# Patient Record
Sex: Male | Born: 1950 | Race: Black or African American | Hispanic: No | Marital: Married | Smoking: Current some day smoker
Health system: Southern US, Community
[De-identification: ages and names within clinical notes are randomized; demographics above are authoritative.]

## PROBLEM LIST (undated history)

## (undated) DIAGNOSIS — I10 Essential (primary) hypertension: Secondary | ICD-10-CM

## (undated) HISTORY — PX: EXCISION OF ABDOMINAL WALL TUMOR: SHX6687

---

## 1997-10-06 ENCOUNTER — Emergency Department (HOSPITAL_COMMUNITY): Admission: EM | Admit: 1997-10-06 | Discharge: 1997-10-06 | Payer: Self-pay | Admitting: Internal Medicine

## 2010-12-25 ENCOUNTER — Other Ambulatory Visit: Payer: Self-pay | Admitting: Chiropractic Medicine

## 2010-12-25 DIAGNOSIS — M545 Low back pain: Secondary | ICD-10-CM

## 2010-12-26 ENCOUNTER — Ambulatory Visit
Admission: RE | Admit: 2010-12-26 | Discharge: 2010-12-26 | Disposition: A | Discharge status: Home / Self Care | Payer: Managed Care, Other (non HMO) | Source: Ambulatory Visit | Attending: Chiropractic Medicine | Admitting: Chiropractic Medicine

## 2010-12-26 DIAGNOSIS — M545 Low back pain: Secondary | ICD-10-CM

## 2017-05-20 DIAGNOSIS — H6123 Impacted cerumen, bilateral: Secondary | ICD-10-CM | POA: Diagnosis not present

## 2018-05-26 DIAGNOSIS — R3912 Poor urinary stream: Secondary | ICD-10-CM | POA: Diagnosis not present

## 2018-05-26 DIAGNOSIS — R972 Elevated prostate specific antigen [PSA]: Secondary | ICD-10-CM | POA: Diagnosis not present

## 2018-05-26 DIAGNOSIS — N5201 Erectile dysfunction due to arterial insufficiency: Secondary | ICD-10-CM | POA: Diagnosis not present

## 2018-07-13 DIAGNOSIS — I10 Essential (primary) hypertension: Secondary | ICD-10-CM | POA: Diagnosis not present

## 2018-07-13 DIAGNOSIS — R972 Elevated prostate specific antigen [PSA]: Secondary | ICD-10-CM | POA: Diagnosis not present

## 2018-08-03 DIAGNOSIS — L989 Disorder of the skin and subcutaneous tissue, unspecified: Secondary | ICD-10-CM | POA: Diagnosis not present

## 2018-08-03 DIAGNOSIS — L918 Other hypertrophic disorders of the skin: Secondary | ICD-10-CM | POA: Diagnosis not present

## 2018-08-03 DIAGNOSIS — M542 Cervicalgia: Secondary | ICD-10-CM | POA: Diagnosis not present

## 2018-08-03 DIAGNOSIS — I10 Essential (primary) hypertension: Secondary | ICD-10-CM | POA: Diagnosis not present

## 2018-08-03 DIAGNOSIS — R202 Paresthesia of skin: Secondary | ICD-10-CM | POA: Diagnosis not present

## 2018-08-27 DIAGNOSIS — S4991XA Unspecified injury of right shoulder and upper arm, initial encounter: Secondary | ICD-10-CM | POA: Diagnosis not present

## 2018-08-27 DIAGNOSIS — I10 Essential (primary) hypertension: Secondary | ICD-10-CM | POA: Diagnosis not present

## 2019-01-25 ENCOUNTER — Other Ambulatory Visit: Payer: Self-pay

## 2019-01-25 DIAGNOSIS — Z20822 Contact with and (suspected) exposure to covid-19: Secondary | ICD-10-CM

## 2019-01-26 LAB — NOVEL CORONAVIRUS, NAA: SARS-CoV-2, NAA: NOT DETECTED

## 2019-06-22 DIAGNOSIS — R3912 Poor urinary stream: Secondary | ICD-10-CM | POA: Diagnosis not present

## 2019-06-22 DIAGNOSIS — R972 Elevated prostate specific antigen [PSA]: Secondary | ICD-10-CM | POA: Diagnosis not present

## 2019-06-22 DIAGNOSIS — N5201 Erectile dysfunction due to arterial insufficiency: Secondary | ICD-10-CM | POA: Diagnosis not present

## 2019-07-28 DIAGNOSIS — M25511 Pain in right shoulder: Secondary | ICD-10-CM | POA: Diagnosis not present

## 2019-07-28 DIAGNOSIS — M1990 Unspecified osteoarthritis, unspecified site: Secondary | ICD-10-CM | POA: Diagnosis not present

## 2019-07-28 DIAGNOSIS — M7581 Other shoulder lesions, right shoulder: Secondary | ICD-10-CM | POA: Diagnosis not present

## 2019-12-03 DIAGNOSIS — M67911 Unspecified disorder of synovium and tendon, right shoulder: Secondary | ICD-10-CM | POA: Diagnosis not present

## 2019-12-03 DIAGNOSIS — M542 Cervicalgia: Secondary | ICD-10-CM | POA: Diagnosis not present

## 2020-01-26 DIAGNOSIS — M5416 Radiculopathy, lumbar region: Secondary | ICD-10-CM | POA: Diagnosis not present

## 2020-02-22 DIAGNOSIS — I10 Essential (primary) hypertension: Secondary | ICD-10-CM | POA: Diagnosis not present

## 2020-02-22 DIAGNOSIS — M5416 Radiculopathy, lumbar region: Secondary | ICD-10-CM | POA: Diagnosis not present

## 2020-02-22 DIAGNOSIS — Z6829 Body mass index (BMI) 29.0-29.9, adult: Secondary | ICD-10-CM | POA: Diagnosis not present

## 2020-03-27 DIAGNOSIS — N5201 Erectile dysfunction due to arterial insufficiency: Secondary | ICD-10-CM | POA: Diagnosis not present

## 2020-03-27 DIAGNOSIS — R3912 Poor urinary stream: Secondary | ICD-10-CM | POA: Diagnosis not present

## 2020-03-27 DIAGNOSIS — R972 Elevated prostate specific antigen [PSA]: Secondary | ICD-10-CM | POA: Diagnosis not present

## 2020-04-05 DIAGNOSIS — M5416 Radiculopathy, lumbar region: Secondary | ICD-10-CM | POA: Diagnosis not present

## 2021-04-25 ENCOUNTER — Other Ambulatory Visit: Payer: Self-pay | Admitting: Urology

## 2021-04-25 DIAGNOSIS — C61 Malignant neoplasm of prostate: Secondary | ICD-10-CM

## 2021-05-26 ENCOUNTER — Other Ambulatory Visit: Payer: Self-pay

## 2021-05-26 ENCOUNTER — Ambulatory Visit
Admission: RE | Admit: 2021-05-26 | Discharge: 2021-05-26 | Disposition: A | Discharge status: Home / Self Care | Payer: Managed Care, Other (non HMO) | Source: Ambulatory Visit | Attending: Urology | Admitting: Urology

## 2021-05-26 DIAGNOSIS — N402 Nodular prostate without lower urinary tract symptoms: Secondary | ICD-10-CM | POA: Diagnosis not present

## 2021-05-26 DIAGNOSIS — N400 Enlarged prostate without lower urinary tract symptoms: Secondary | ICD-10-CM | POA: Diagnosis not present

## 2021-05-26 DIAGNOSIS — C61 Malignant neoplasm of prostate: Secondary | ICD-10-CM

## 2021-05-26 DIAGNOSIS — R590 Localized enlarged lymph nodes: Secondary | ICD-10-CM | POA: Diagnosis not present

## 2021-05-26 MED ORDER — GADOBENATE DIMEGLUMINE 529 MG/ML IV SOLN
20.0000 mL | Freq: Once | INTRAVENOUS | Status: AC | PRN
  Administered 2021-05-26: 20 mL via INTRAVENOUS

## 2021-06-01 ENCOUNTER — Other Ambulatory Visit: Payer: Self-pay | Admitting: Urology

## 2021-06-01 DIAGNOSIS — C499 Malignant neoplasm of connective and soft tissue, unspecified: Secondary | ICD-10-CM

## 2021-06-15 ENCOUNTER — Other Ambulatory Visit: Payer: Self-pay | Admitting: Urology

## 2021-06-15 DIAGNOSIS — R1900 Intra-abdominal and pelvic swelling, mass and lump, unspecified site: Secondary | ICD-10-CM

## 2021-06-16 ENCOUNTER — Inpatient Hospital Stay: Admission: RE | Admit: 2021-06-16 | Payer: BC Managed Care – PPO | Source: Ambulatory Visit

## 2021-07-14 ENCOUNTER — Ambulatory Visit
Admission: RE | Admit: 2021-07-14 | Discharge: 2021-07-14 | Disposition: A | Discharge status: Home / Self Care | Payer: BC Managed Care – PPO | Source: Ambulatory Visit | Attending: Urology | Admitting: Urology

## 2021-07-14 DIAGNOSIS — R1900 Intra-abdominal and pelvic swelling, mass and lump, unspecified site: Secondary | ICD-10-CM | POA: Diagnosis not present

## 2021-07-14 DIAGNOSIS — C499 Malignant neoplasm of connective and soft tissue, unspecified: Secondary | ICD-10-CM

## 2021-07-14 MED ORDER — GADOBENATE DIMEGLUMINE 529 MG/ML IV SOLN
19.0000 mL | Freq: Once | INTRAVENOUS | Status: AC | PRN
  Administered 2021-07-14: 19 mL via INTRAVENOUS

## 2021-10-08 DIAGNOSIS — R1900 Intra-abdominal and pelvic swelling, mass and lump, unspecified site: Secondary | ICD-10-CM | POA: Diagnosis not present

## 2021-10-08 DIAGNOSIS — Z6831 Body mass index (BMI) 31.0-31.9, adult: Secondary | ICD-10-CM | POA: Diagnosis not present

## 2021-11-02 DIAGNOSIS — Z8589 Personal history of malignant neoplasm of other organs and systems: Secondary | ICD-10-CM | POA: Diagnosis not present

## 2021-11-02 DIAGNOSIS — R1900 Intra-abdominal and pelvic swelling, mass and lump, unspecified site: Secondary | ICD-10-CM | POA: Diagnosis not present

## 2021-11-02 DIAGNOSIS — D1723 Benign lipomatous neoplasm of skin and subcutaneous tissue of right leg: Secondary | ICD-10-CM | POA: Diagnosis not present

## 2021-11-02 DIAGNOSIS — I10 Essential (primary) hypertension: Secondary | ICD-10-CM | POA: Diagnosis not present

## 2021-11-02 DIAGNOSIS — D171 Benign lipomatous neoplasm of skin and subcutaneous tissue of trunk: Secondary | ICD-10-CM | POA: Diagnosis not present

## 2021-11-12 DIAGNOSIS — I10 Essential (primary) hypertension: Secondary | ICD-10-CM | POA: Diagnosis not present

## 2021-11-12 DIAGNOSIS — D179 Benign lipomatous neoplasm, unspecified: Secondary | ICD-10-CM | POA: Diagnosis not present

## 2021-11-12 DIAGNOSIS — R1900 Intra-abdominal and pelvic swelling, mass and lump, unspecified site: Secondary | ICD-10-CM | POA: Diagnosis not present

## 2021-11-12 DIAGNOSIS — J432 Centrilobular emphysema: Secondary | ICD-10-CM | POA: Diagnosis not present

## 2021-11-12 DIAGNOSIS — Z6830 Body mass index (BMI) 30.0-30.9, adult: Secondary | ICD-10-CM | POA: Diagnosis not present

## 2021-11-12 DIAGNOSIS — C480 Malignant neoplasm of retroperitoneum: Secondary | ICD-10-CM | POA: Diagnosis not present

## 2021-11-13 ENCOUNTER — Other Ambulatory Visit: Payer: Self-pay | Admitting: Radiation Oncology

## 2021-11-13 ENCOUNTER — Telehealth: Payer: Self-pay | Admitting: Radiation Oncology

## 2021-11-13 ENCOUNTER — Ambulatory Visit
Admission: RE | Admit: 2021-11-13 | Discharge: 2021-11-13 | Disposition: A | Discharge status: Home / Self Care | Payer: Self-pay | Source: Ambulatory Visit | Attending: Radiation Oncology | Admitting: Radiation Oncology

## 2021-11-13 DIAGNOSIS — D179 Benign lipomatous neoplasm, unspecified: Secondary | ICD-10-CM

## 2021-11-13 NOTE — Telephone Encounter (Signed)
8/1 @ 3:22 pm Left voicemail for patient to call our office.

## 2021-11-14 ENCOUNTER — Telehealth: Payer: Self-pay | Admitting: Radiation Oncology

## 2021-11-14 NOTE — Telephone Encounter (Signed)
8/2 @ 10:19 am Left voicemail for patient to call our office to be schedule for consult with Dr. Lisbeth Renshaw.

## 2021-11-20 DIAGNOSIS — Z6830 Body mass index (BMI) 30.0-30.9, adult: Secondary | ICD-10-CM | POA: Diagnosis not present

## 2021-11-20 DIAGNOSIS — C480 Malignant neoplasm of retroperitoneum: Secondary | ICD-10-CM | POA: Diagnosis not present

## 2021-11-21 NOTE — Progress Notes (Signed)
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  CT CAP 11/12/2021: A retroperitoneal fat-containing lesion measuring 22.4 cm, previously 21.6 cm from MRI dated February 2023.  Differential includes lipoma, atypical lipomatous lesion, versus liposarcoma.  Non-specific fatty stranding and prominent lymph nodes in the root of the small bowel mesentery with non-enlarged lymph nodes measuring up to 0.8 cm favoring mesentery right.   No intrathoracic metastasis.  Pelvic MRI 07/14/2021: 10 x 7 x 23 cm soft tissue mass in the retroperitoneum along the right side of the pelvis enveloping the right iliac [place] muscle and right psoas muscle.  The cranial extent of the mass came to the inferior border of the right kidney while the caudad extent of the mass extended out of the pelvis and into the right inguinal region to the level of the greater trochanter.  In addition to abutting/involving the musculature, the mass abuts the inguinal vessels and possibly the femoral nerve.   Pathology Report: 11/02/2021    Past/Anticipated chemotherapy by surgical oncology, if any:  Dr. Maudie Mercury / Conni Slipper FNP 11/12/2021 -The patient now has a biopsy-proven liposarcoma of the right pelvis/retroperitoneum, involving the right iliacus and psoas muscles. The tumor also extends into the right proximal thigh.  -The patient was discussed with radiation oncology, and will undergo neoadjuvant RT (likely closer to home).  -He was seen by Dr. Francesca Jewett. A referral was also placed for Dr. Sherlynn Stalls of orthopedic oncology, and we will also likely involve Plastic Surgery. I will see him at the end of neoadjuvant RT, with repeat staging studies.   Weight changes:  Pain on a scale of 0-10 is:   Mobility:  Bowels/Bladder:   Ambulatory status?  Walker? Wheelchair?:   SAFETY ISSUES: Prior radiation?  Pacemaker/ICD?  Possible current pregnancy? N/a Is the patient on methotrexate?   Current Complaints / other details:   History of Prostate Cancer diagnosed in August 2022.  He has been under active surveillance by Dr. Louis Meckel at [facility].

## 2021-11-22 ENCOUNTER — Ambulatory Visit
Admission: RE | Admit: 2021-11-22 | Discharge: 2021-11-22 | Disposition: A | Discharge status: Home / Self Care | Payer: BC Managed Care – PPO | Source: Ambulatory Visit | Attending: Radiation Oncology | Admitting: Radiation Oncology

## 2021-11-22 ENCOUNTER — Other Ambulatory Visit: Payer: Self-pay

## 2021-11-22 ENCOUNTER — Encounter: Payer: Self-pay | Admitting: Radiation Oncology

## 2021-11-22 VITALS — BP 144/78 | HR 64 | Temp 97.6°F | Resp 20 | Ht 69.0 in | Wt 207.6 lb

## 2021-11-22 DIAGNOSIS — C480 Malignant neoplasm of retroperitoneum: Secondary | ICD-10-CM | POA: Insufficient documentation

## 2021-11-22 DIAGNOSIS — Z803 Family history of malignant neoplasm of breast: Secondary | ICD-10-CM | POA: Insufficient documentation

## 2021-11-22 DIAGNOSIS — F17210 Nicotine dependence, cigarettes, uncomplicated: Secondary | ICD-10-CM | POA: Diagnosis not present

## 2021-11-22 DIAGNOSIS — Z8546 Personal history of malignant neoplasm of prostate: Secondary | ICD-10-CM | POA: Diagnosis not present

## 2021-11-22 DIAGNOSIS — I10 Essential (primary) hypertension: Secondary | ICD-10-CM | POA: Insufficient documentation

## 2021-11-22 HISTORY — DX: Essential (primary) hypertension: I10

## 2021-11-22 NOTE — Progress Notes (Signed)
Radiation Oncology         [phone] ________________________________  Name: Keith Riley        MRN: [id number]  Date of Service: 11/22/2021 DOB: [birth year]  CC:Pcp, No  Meda Klinefelter, MD     REFERRING PHYSICIAN: Meda Klinefelter, MD   DIAGNOSIS: The encounter diagnosis was Liposarcoma of retroperitoneum [facility]).   HISTORY OF PRESENT ILLNESS: Keith Riley is a 71 y.o. male seen at the request of Dr. Francesca Jewett for a patient with retroperitoneal liposarcoma as well as a history of prostate cancer. 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 Louis Meckel. He had an MRI pelvis on 05/26/21 that showed no lesion of the prostate but incidental fatty mass in the right iliacus and right psoas musculature. MRI pelvis on 07/14/21 showed a 22.5 cm mass in the right pelvis arising deep to the iliacus muscle and extending superiorly to the lower border of the right kidney and inferiorly into the proximal right thigh. It was biopsied on 11/02/21 showing atypical lipomatous tumor. CT CAP on 11/12/21 did not show metastatic disease. He met with radiation and surgical oncology at [facility] and he was offered neoadjuvant radiotherapy followed by resection with surgical oncology and plastic surgery. He was seen with Dr. Francesca Jewett and offered radiotherapy but as the patient lives in [place] is seen to discuss radiation in [place].     PREVIOUS RADIATION THERAPY: No   PAST MEDICAL HISTORY:  Past Medical History:  Diagnosis Date   Hypertension        PAST SURGICAL HISTORY:History reviewed. No pertinent surgical history.   FAMILY HISTORY:  Family History  Problem Relation Age of Onset   Breast cancer Mother    Heart disease Father      SOCIAL HISTORY:  reports that he has been smoking e-cigarettes. He has never used smokeless tobacco. He reports that he does not currently use alcohol. He reports that  he does not use drugs. The patient is married and lives in [place]. 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    ALLERGIES: Patient has no allergy information on record.   MEDICATIONS:  Current Outpatient Medications  Medication Sig Dispense Refill   amLODipine (NORVASC) 10 MG tablet 1 tablet     sildenafil (VIAGRA) 100 MG tablet Take 1 tablet by mouth daily as needed.     No current facility-administered medications for this encounter.     REVIEW OF SYSTEMS: On review of systems, the patient reports that he is not having any pain or physical limitations. He has some tiredness in his legs when standing for long periods of time.  No other complaints are verbalized.      PHYSICAL EXAM:  Wt Readings from Last 3 Encounters:  11/22/21 207 lb 9.6 oz (94.2 kg)   Temp Readings from Last 3 Encounters:  11/22/21 97.6 F (36.4 C)   BP Readings from Last 3 Encounters:  11/22/21 (!) 144/78   Pulse Readings from Last 3 Encounters:  11/22/21 64   Pain Assessment Pain Score: 0-No pain/10  In general this is a well appearing African American male in no acute distress. He's alert and oriented x4 and appropriate throughout the examination. Cardiopulmonary assessment is negative for acute distress and he exhibits normal effort.     ECOG = 1  0 - Asymptomatic (Fully active, able to carry on all predisease activities without restriction)  1 - Symptomatic but completely ambulatory (Restricted  in physically strenuous activity but ambulatory and able to carry out work of a light or sedentary nature. For example, light housework, office work)  2 - Symptomatic, <50% in bed during the day (Ambulatory and capable of all self care but unable to carry out any work activities. Up and about more than 50% of waking hours)  3 - Symptomatic, >50% in bed, but not bedbound (Capable of only limited self-care, confined to bed or chair 50% or more of waking hours)  4 - Bedbound (Completely disabled. Cannot carry  on any self-care. Totally confined to bed or chair)  5 - Death   Eustace Pen MM, Creech RH, Tormey DC, et al. [phone]). "Toxicity and response criteria of the [facility]". [place] Oncol. 5 (6): 649-55    LABORATORY DATA:  No results found for: "WBC", "HGB", "HCT", "MCV", "PLT" No results found for: "NA", "K", "CL", "CO2" No results found for: "ALT", "AST", "GGT", "ALKPHOS", "BILITOT"    RADIOGRAPHY: No results found.     IMPRESSION/PLAN: 1. Liposarcoma of the retroperitoneum. Dr. Lisbeth Renshaw discusses the pathology findings and reviews the nature of sarcomatous disease.  Given the extensive findings from his imaging, it has been recommended that he undergo a neoadjuvant course of radiotherapy followed by surgical resection. We discussed the risks, benefits, short, and long term effects of radiotherapy, as well as the curative intent, and the patient is interested in proceeding. Dr. Lisbeth Renshaw discusses the delivery and logistics of radiotherapy and anticipates a course of 5 weeks of radiotherapy to the pelvis including the musculature extending into the thigh. We discussed a referral for 'prehab" assessment with physical therapy in case he develops long term side effects from this therapy. Written consent is obtained and placed in the chart, a copy was provided to the patient. He will simulate tomorrow and start the week of 12/03/21.     In a visit lasting 60 minutes, greater than 50% of the time was spent face to face discussing the patient's condition, in preparation for the discussion, and coordinating the patient's care.   The above documentation reflects my direct findings during this shared patient visit. Please see the separate note by Dr. Lisbeth Renshaw on this date for the remainder of the patient's plan of care.    Carola Rhine, [facility]   **Disclaimer: This note was dictated with voice recognition software. Similar sounding words can inadvertently be transcribed and this note  may contain transcription errors which may not have been corrected upon publication of note.**

## 2021-11-23 ENCOUNTER — Ambulatory Visit
Admission: RE | Admit: 2021-11-23 | Discharge: 2021-11-23 | Disposition: A | Discharge status: Home / Self Care | Payer: BC Managed Care – PPO | Source: Ambulatory Visit | Attending: Radiation Oncology | Admitting: Radiation Oncology

## 2021-11-23 DIAGNOSIS — C480 Malignant neoplasm of retroperitoneum: Secondary | ICD-10-CM | POA: Diagnosis not present

## 2021-11-23 DIAGNOSIS — Z510 Encounter for antineoplastic radiation therapy: Secondary | ICD-10-CM | POA: Diagnosis present

## 2021-11-27 NOTE — Therapy (Signed)
OUTPATIENT PHYSICAL THERAPY ONCOLOGY EVALUATION  Patient Name: Keith Riley MRN: [id number] DOB:05/05/1950, 71 y.o., male Today's Date: 11/28/2021   PT End of Session - 11/28/21 1212     Visit Number 1    Number of Visits 2    Date for PT Re-Evaluation 03/20/22    PT Start Time 1115   pt late   PT Stop Time 1148    PT Time Calculation (min) 33 min    Activity Tolerance Patient tolerated treatment well    Behavior During Therapy [facility] for tasks assessed/performed             Past Medical History:  Diagnosis Date   Hypertension    History reviewed. No pertinent surgical history. Patient Active Problem List   Diagnosis Date Noted   Liposarcoma of retroperitoneum ([place]) 11/22/2021    PCP: Dr Bernie Covey  REFERRING PROVIDER: Shona Simpson, NP  REFERRING DIAG: Right retroperitoneal sarcoma  THERAPY DIAG:  Malignant neoplasm of retroperitoneum [facility])  ONSET DATE: Feb. 2023  Rationale for Evaluation and Treatment Rehabilitation  SUBJECTIVE                                                                                                                                                                                           SUBJECTIVE STATEMENT: Pt is here to be seen by his medical team for his newly diagnosed retroperitoneal liposarcoma.   PERTINENT HISTORY:   Pt was diagnosed with a 23 cm Right retroperitoneal liposarcoma - extending from right retroperitoneum, through pelvis, to right thigh.  He was diagnosed with prostate cancer (Gleason score 6) in August 2022 and has been under active surveillance by Dr. Louis Meckel from [facility] in [place], [place]. The retroperitoneal mass was incidentally found in February of 2023 during MRI surveillance of his prostate cancer. Repeat pelvic MRI of the pelvis performed on July 14, 2021, revealed a 10 x 7 x 23 cm right retroperitoneal soft tissue mass with a few thin enhancing septations. The mass originates deep from the  right iliacus muscle and extends superiorly along the psoas muscle to the inferior border of the right kidney. It also extends inferiorly along the iliopsoas tendon and into the proximal thigh up to the level of the greater trochanter. He underwent CT simulation  and will start radiation. There are concerns for pelvic floor dysfunction and lymphedema post radiation. He will start radiation Aug 21 for 5 weeks,  and then have a break for 6-8 weeks before surgery.    PAIN:  Are you having pain? No PRECAUTIONS: Other: Right LE lymphedema risk  WEIGHT BEARING RESTRICTIONS No  FALLS:  Has patient  fallen in last 6 months? No  LIVING ENVIRONMENT: Lives with: lives with their family Lives in: House/apartment Stairs: Yes; Internal: 12 steps; can reach both Has following equipment at home: None  OCCUPATION: yes, school bus driver  LEISURE: walking  HAND DOMINANCE : right   PRIOR LEVEL OF FUNCTION: Independent  PATIENT GOALS Screening baselines for prevention of lymphedema   OBJECTIVE  COGNITION:  Overall cognitive status: Within functional limits for tasks assessed    OBSERVATIONS / OTHER ASSESSMENTS: Normal appearance bilateral LE  SENSATION:  Light touch: Appears intact      :        LOWER EXTREMITY AROM/PROM: appears WNL   LOWER EXTREMITY MMT: NT  LYMPHEDEMA ASSESSMENTS:   SURGERY TYPE/DATE: removal of retroperitonal liposarcoma TBD  NUMBER OF LYMPH NODES REMOVED: NA at present  CHEMOTHERAPY: NA  RADIATION:to start Aug 21 for 5 weeks, then surgery date TBD  HORMONE TREATMENT: no  INFECTIONS: no  LYMPHEDEMA ASSESSMENTS:     LOWER EXTREMITY LANDMARK RIGHT eval  At groin   30 cm proximal to suprapatella   20 cm proximal to suprapatella 56.2  10 cm proximal to suprapatella 48.2  At midpatella / popliteal crease 41.5  30 cm proximal to floor at lateral plantar foot 34.4  20 cm proximal to floor at lateral plantar foot 24  10 cm proximal to floor  at lateral plantar foot 24.2  Circumference of ankle/heel   5 cm proximal to 1st MTP joint 25.7  Across MTP joint   Around proximal great toe 9.1  (Blank rows = not tested)  LOWER EXTREMITY LANDMARK LEFT eval  At groin   30 cm proximal to suprapatella   20 cm proximal to suprapatella 55.8  10 cm proximal to suprapatella 48  At midpatella / popliteal crease 40.6  30 cm proximal to floor at lateral plantar foot 32.7  20 cm proximal to floor at lateral plantar foot 23.5  10 cm proximal to floor at lateral plantar foot 23.7  Circumference of ankle/heel   5 cm proximal to 1st MTP joint 24.9  Across MTP joint   Around proximal great toe 9.1  (Blank rows = not tested)    GAIT: Distance walked: independent ambulatory no device, walking 1 mile daily  L-DEX LYMPHEDEMA SCREENING:  The patient was assessed using the L-Dex machine today to produce a lymphedema index baseline score. The patient will be reassessed on a regular basis (typically every 3 months) to obtain new L-Dex scores. If the score is > 6.5 points away from his/her baseline score indicating onset of subclinical lymphedema, it will be recommended to wear a compression garment for 4 weeks, 12 hours per day and then be reassessed. If the score continues to be > 6.5 points from baseline at reassessment, we will initiate lymphedema treatment. Assessing in this manner has a 95% rate of preventing clinically significant lymphedema.   L-DEX FLOWSHEETS - 11/28/21 1100       L-DEX LYMPHEDEMA SCREENING   Measurement Type Unilateral    L-DEX MEASUREMENT EXTREMITY Lower Extremity    POSITION  Standing    DOMINANT SIDE Right    At Risk Side Right    BASELINE SCORE (UNILATERAL) -3.5                TODAY'S TREATMENT  Pt was educated in Lymphedema and precautions for lymphedema due to radiation. Treatment consisted of baseline circumference measures of the lower extremities, and SOZO baseline  PATIENT EDUCATION:  Education  details: lymphedema  risk reduction, SOZO screens, Pelvic floor eval scheduled Person educated: Patient Education method: Explanation and Handouts Education comprehension: verbalized understanding and returned demonstration   ASSESSMENT:  CLINICAL IMPRESSION: Patient is a 71 y.o. male who was seen today for physical therapy evaluation and treatment for baselines prior to starting radiation for his newly diagnosed retroperitoneal sarcoma. He is planning to start radiation Aug 21 and surgery date will be likely 6 weeks following. Pt. will benefit from a reassessment post treatment to determine needs and from L-Dex screens every 3 months for 2 years to detect subclinical lymphedema    OBJECTIVE IMPAIRMENTS decreased knowledge of condition.   ACTIVITY LIMITATIONS    none presently  PARTICIPATION LIMITATIONS:  none presently  PERSONAL FACTORS 1-2 comorbidities: prostate cancer and newly diagnosed retroperitoneal liposarcoma pending radiation and surgery  are also affecting patient's functional outcome.   REHAB POTENTIAL: Good  CLINICAL DECISION MAKING: Stable/uncomplicated  EVALUATION COMPLEXITY: Low  GOALS: Goals reviewed with patient? Yes    STG -LONG TERM GOALS: Target date: 03/20/2022    1.pt will be able to verbalize understanding of pertinenet lymphedema risk reduction practices relevant to dx including skin care Baseline:  Goal status: METat eval    2  Pt will demonstrate he has regained hip and knee ROM and function post treatment Baseline: WNL Goal status: INITIAL     PLAN: PT FREQUENCY: evaluation and 1 follow up  PT DURATION: will reassess in approx 4 weeks post surgery  PLANNED INTERVENTIONS: Therapeutic exercises, Therapeutic activity, Neuromuscular re-education, Balance training, Gait training, Patient/Family education, Self Care, Manual lymph drainage, Manual therapy, and Re-evaluation  PLAN FOR NEXT SESSION: will reassess in approx 4 weeks post  treatment Pt will follow up at outpatient cancer rehab approximately 4 weeks aftersurgery or as allowed by physician. If the pt requires physical therapy at that time a specific plan of care will be documented and sent to referring physician for approval   Claris Pong, PT 11/28/2021, 12:14 PM

## 2021-11-28 ENCOUNTER — Ambulatory Visit: Payer: BC Managed Care – PPO | Attending: Radiation Oncology

## 2021-11-28 DIAGNOSIS — I890 Lymphedema, not elsewhere classified: Secondary | ICD-10-CM | POA: Insufficient documentation

## 2021-11-28 DIAGNOSIS — C480 Malignant neoplasm of retroperitoneum: Secondary | ICD-10-CM | POA: Diagnosis not present

## 2021-12-04 DIAGNOSIS — C480 Malignant neoplasm of retroperitoneum: Secondary | ICD-10-CM | POA: Diagnosis not present

## 2021-12-04 DIAGNOSIS — Z510 Encounter for antineoplastic radiation therapy: Secondary | ICD-10-CM | POA: Diagnosis not present

## 2021-12-05 ENCOUNTER — Ambulatory Visit
Admission: RE | Admit: 2021-12-05 | Discharge: 2021-12-05 | Disposition: A | Discharge status: Home / Self Care | Payer: BC Managed Care – PPO | Source: Ambulatory Visit | Attending: Radiation Oncology | Admitting: Radiation Oncology

## 2021-12-05 ENCOUNTER — Other Ambulatory Visit: Payer: Self-pay

## 2021-12-05 DIAGNOSIS — Z510 Encounter for antineoplastic radiation therapy: Secondary | ICD-10-CM | POA: Diagnosis not present

## 2021-12-05 DIAGNOSIS — C480 Malignant neoplasm of retroperitoneum: Secondary | ICD-10-CM | POA: Diagnosis not present

## 2021-12-05 LAB — RAD ONC ARIA SESSION SUMMARY
Course Elapsed Days: 0
Plan Fractions Treated to Date: 1
Plan Prescribed Dose Per Fraction: 2 Gy
Plan Total Fractions Prescribed: 25
Plan Total Prescribed Dose: 50 Gy
Reference Point Dosage Given to Date: 2 Gy
Reference Point Session Dosage Given: 2 Gy
Session Number: 1

## 2021-12-05 NOTE — Progress Notes (Signed)
Pt here for patient teaching.  Pt given Radiation and You booklet, skin care instructions, and Sonafine.  Reviewed areas of pertinence such as diarrhea, fatigue, hair loss, nausea and vomiting, sexual and fertility changes, skin changes, and urinary and bladder changes . Pt able to give teach back of to pat skin, use unscented/gentle soap, use baby wipes, have Imodium on hand, drink plenty of water, and sitz bath,apply Sonafine bid and avoid applying anything to skin within 4 hours of treatment. Pt verbalizes understanding of information given and will contact nursing with any questions or concerns.    Gloriajean Dell. Leonie Green, BSN

## 2021-12-06 ENCOUNTER — Ambulatory Visit
Admission: RE | Admit: 2021-12-06 | Discharge: 2021-12-06 | Disposition: A | Discharge status: Home / Self Care | Payer: BC Managed Care – PPO | Source: Ambulatory Visit | Attending: Radiation Oncology | Admitting: Radiation Oncology

## 2021-12-06 ENCOUNTER — Other Ambulatory Visit: Payer: Self-pay

## 2021-12-06 DIAGNOSIS — Z510 Encounter for antineoplastic radiation therapy: Secondary | ICD-10-CM | POA: Diagnosis not present

## 2021-12-06 DIAGNOSIS — C480 Malignant neoplasm of retroperitoneum: Secondary | ICD-10-CM | POA: Diagnosis not present

## 2021-12-06 LAB — RAD ONC ARIA SESSION SUMMARY
Course Elapsed Days: 1
Plan Fractions Treated to Date: 2
Plan Prescribed Dose Per Fraction: 2 Gy
Plan Total Fractions Prescribed: 25
Plan Total Prescribed Dose: 50 Gy
Reference Point Dosage Given to Date: 4 Gy
Reference Point Session Dosage Given: 2 Gy
Session Number: 2

## 2021-12-07 ENCOUNTER — Ambulatory Visit
Admission: RE | Admit: 2021-12-07 | Discharge: 2021-12-07 | Disposition: A | Discharge status: Home / Self Care | Payer: BC Managed Care – PPO | Source: Ambulatory Visit | Attending: Radiation Oncology | Admitting: Radiation Oncology

## 2021-12-07 ENCOUNTER — Other Ambulatory Visit: Payer: Self-pay

## 2021-12-07 DIAGNOSIS — C480 Malignant neoplasm of retroperitoneum: Secondary | ICD-10-CM | POA: Diagnosis not present

## 2021-12-07 DIAGNOSIS — Z510 Encounter for antineoplastic radiation therapy: Secondary | ICD-10-CM | POA: Diagnosis not present

## 2021-12-07 DIAGNOSIS — C61 Malignant neoplasm of prostate: Secondary | ICD-10-CM | POA: Diagnosis not present

## 2021-12-07 DIAGNOSIS — N5201 Erectile dysfunction due to arterial insufficiency: Secondary | ICD-10-CM | POA: Diagnosis not present

## 2021-12-07 LAB — RAD ONC ARIA SESSION SUMMARY
Course Elapsed Days: 2
Plan Fractions Treated to Date: 3
Plan Prescribed Dose Per Fraction: 2 Gy
Plan Total Fractions Prescribed: 25
Plan Total Prescribed Dose: 50 Gy
Reference Point Dosage Given to Date: 6 Gy
Reference Point Session Dosage Given: 2 Gy
Session Number: 3

## 2021-12-07 MED ORDER — SONAFINE EX EMUL
1.0000 | Freq: Once | CUTANEOUS | Status: AC
  Administered 2021-12-07: 1 via TOPICAL

## 2021-12-10 ENCOUNTER — Other Ambulatory Visit: Payer: Self-pay

## 2021-12-10 ENCOUNTER — Ambulatory Visit
Admission: RE | Admit: 2021-12-10 | Discharge: 2021-12-10 | Disposition: A | Discharge status: Home / Self Care | Payer: BC Managed Care – PPO | Source: Ambulatory Visit | Attending: Radiation Oncology | Admitting: Radiation Oncology

## 2021-12-10 DIAGNOSIS — C480 Malignant neoplasm of retroperitoneum: Secondary | ICD-10-CM | POA: Diagnosis not present

## 2021-12-10 DIAGNOSIS — Z510 Encounter for antineoplastic radiation therapy: Secondary | ICD-10-CM | POA: Diagnosis not present

## 2021-12-10 LAB — RAD ONC ARIA SESSION SUMMARY
Course Elapsed Days: 5
Plan Fractions Treated to Date: 4
Plan Prescribed Dose Per Fraction: 2 Gy
Plan Total Fractions Prescribed: 25
Plan Total Prescribed Dose: 50 Gy
Reference Point Dosage Given to Date: 8 Gy
Reference Point Session Dosage Given: 2 Gy
Session Number: 4

## 2021-12-11 ENCOUNTER — Ambulatory Visit
Admission: RE | Admit: 2021-12-11 | Discharge: 2021-12-11 | Disposition: A | Discharge status: Home / Self Care | Payer: BC Managed Care – PPO | Source: Ambulatory Visit | Attending: Radiation Oncology | Admitting: Radiation Oncology

## 2021-12-11 ENCOUNTER — Other Ambulatory Visit: Payer: Self-pay

## 2021-12-11 DIAGNOSIS — C480 Malignant neoplasm of retroperitoneum: Secondary | ICD-10-CM | POA: Diagnosis not present

## 2021-12-11 DIAGNOSIS — Z510 Encounter for antineoplastic radiation therapy: Secondary | ICD-10-CM | POA: Diagnosis not present

## 2021-12-11 LAB — RAD ONC ARIA SESSION SUMMARY
Course Elapsed Days: 6
Plan Fractions Treated to Date: 5
Plan Prescribed Dose Per Fraction: 2 Gy
Plan Total Fractions Prescribed: 25
Plan Total Prescribed Dose: 50 Gy
Reference Point Dosage Given to Date: 10 Gy
Reference Point Session Dosage Given: 2 Gy
Session Number: 5

## 2021-12-12 ENCOUNTER — Other Ambulatory Visit: Payer: Self-pay

## 2021-12-12 ENCOUNTER — Ambulatory Visit
Admission: RE | Admit: 2021-12-12 | Discharge: 2021-12-12 | Disposition: A | Discharge status: Home / Self Care | Payer: BC Managed Care – PPO | Source: Ambulatory Visit | Attending: Radiation Oncology | Admitting: Radiation Oncology

## 2021-12-12 DIAGNOSIS — Z510 Encounter for antineoplastic radiation therapy: Secondary | ICD-10-CM | POA: Diagnosis not present

## 2021-12-12 DIAGNOSIS — C480 Malignant neoplasm of retroperitoneum: Secondary | ICD-10-CM | POA: Diagnosis not present

## 2021-12-12 LAB — RAD ONC ARIA SESSION SUMMARY
Course Elapsed Days: 7
Plan Fractions Treated to Date: 6
Plan Prescribed Dose Per Fraction: 2 Gy
Plan Total Fractions Prescribed: 25
Plan Total Prescribed Dose: 50 Gy
Reference Point Dosage Given to Date: 12 Gy
Reference Point Session Dosage Given: 2 Gy
Session Number: 6

## 2021-12-13 ENCOUNTER — Ambulatory Visit
Admission: RE | Admit: 2021-12-13 | Discharge: 2021-12-13 | Disposition: A | Discharge status: Home / Self Care | Payer: BC Managed Care – PPO | Source: Ambulatory Visit | Attending: Radiation Oncology | Admitting: Radiation Oncology

## 2021-12-13 ENCOUNTER — Other Ambulatory Visit: Payer: Self-pay

## 2021-12-13 DIAGNOSIS — C480 Malignant neoplasm of retroperitoneum: Secondary | ICD-10-CM | POA: Diagnosis not present

## 2021-12-13 DIAGNOSIS — Z510 Encounter for antineoplastic radiation therapy: Secondary | ICD-10-CM | POA: Diagnosis not present

## 2021-12-13 LAB — RAD ONC ARIA SESSION SUMMARY
Course Elapsed Days: 8
Plan Fractions Treated to Date: 7
Plan Prescribed Dose Per Fraction: 2 Gy
Plan Total Fractions Prescribed: 25
Plan Total Prescribed Dose: 50 Gy
Reference Point Dosage Given to Date: 14 Gy
Reference Point Session Dosage Given: 2 Gy
Session Number: 7

## 2021-12-14 ENCOUNTER — Ambulatory Visit
Admission: RE | Admit: 2021-12-14 | Discharge: 2021-12-14 | Disposition: A | Discharge status: Home / Self Care | Payer: BC Managed Care – PPO | Source: Ambulatory Visit | Attending: Radiation Oncology | Admitting: Radiation Oncology

## 2021-12-14 ENCOUNTER — Other Ambulatory Visit: Payer: Self-pay

## 2021-12-14 DIAGNOSIS — C480 Malignant neoplasm of retroperitoneum: Secondary | ICD-10-CM | POA: Diagnosis not present

## 2021-12-14 DIAGNOSIS — Z510 Encounter for antineoplastic radiation therapy: Secondary | ICD-10-CM | POA: Insufficient documentation

## 2021-12-14 LAB — RAD ONC ARIA SESSION SUMMARY
Course Elapsed Days: 9
Plan Fractions Treated to Date: 1
Plan Prescribed Dose Per Fraction: 2 Gy
Plan Total Fractions Prescribed: 18
Plan Total Prescribed Dose: 36 Gy
Reference Point Dosage Given to Date: 16 Gy
Reference Point Session Dosage Given: 2 Gy
Session Number: 8

## 2021-12-18 ENCOUNTER — Other Ambulatory Visit: Payer: Self-pay

## 2021-12-18 ENCOUNTER — Ambulatory Visit
Admission: RE | Admit: 2021-12-18 | Discharge: 2021-12-18 | Disposition: A | Discharge status: Home / Self Care | Payer: BC Managed Care – PPO | Source: Ambulatory Visit | Attending: Radiation Oncology | Admitting: Radiation Oncology

## 2021-12-18 DIAGNOSIS — Z510 Encounter for antineoplastic radiation therapy: Secondary | ICD-10-CM | POA: Diagnosis not present

## 2021-12-18 DIAGNOSIS — C480 Malignant neoplasm of retroperitoneum: Secondary | ICD-10-CM | POA: Diagnosis not present

## 2021-12-18 LAB — RAD ONC ARIA SESSION SUMMARY
Course Elapsed Days: 13
Plan Fractions Treated to Date: 2
Plan Prescribed Dose Per Fraction: 2 Gy
Plan Total Fractions Prescribed: 18
Plan Total Prescribed Dose: 36 Gy
Reference Point Dosage Given to Date: 18 Gy
Reference Point Session Dosage Given: 2 Gy
Session Number: 9

## 2021-12-19 ENCOUNTER — Other Ambulatory Visit: Payer: Self-pay

## 2021-12-19 ENCOUNTER — Ambulatory Visit
Admission: RE | Admit: 2021-12-19 | Discharge: 2021-12-19 | Disposition: A | Discharge status: Home / Self Care | Payer: BC Managed Care – PPO | Source: Ambulatory Visit | Attending: Radiation Oncology | Admitting: Radiation Oncology

## 2021-12-19 DIAGNOSIS — Z510 Encounter for antineoplastic radiation therapy: Secondary | ICD-10-CM | POA: Diagnosis not present

## 2021-12-19 DIAGNOSIS — C480 Malignant neoplasm of retroperitoneum: Secondary | ICD-10-CM | POA: Diagnosis not present

## 2021-12-19 LAB — RAD ONC ARIA SESSION SUMMARY
Course Elapsed Days: 14
Plan Fractions Treated to Date: 3
Plan Prescribed Dose Per Fraction: 2 Gy
Plan Total Fractions Prescribed: 18
Plan Total Prescribed Dose: 36 Gy
Reference Point Dosage Given to Date: 20 Gy
Reference Point Session Dosage Given: 2 Gy
Session Number: 10

## 2021-12-20 ENCOUNTER — Other Ambulatory Visit: Payer: Self-pay

## 2021-12-20 ENCOUNTER — Ambulatory Visit
Admission: RE | Admit: 2021-12-20 | Discharge: 2021-12-20 | Disposition: A | Discharge status: Home / Self Care | Payer: BC Managed Care – PPO | Source: Ambulatory Visit | Attending: Radiation Oncology | Admitting: Radiation Oncology

## 2021-12-20 DIAGNOSIS — C480 Malignant neoplasm of retroperitoneum: Secondary | ICD-10-CM | POA: Diagnosis not present

## 2021-12-20 DIAGNOSIS — Z510 Encounter for antineoplastic radiation therapy: Secondary | ICD-10-CM | POA: Diagnosis not present

## 2021-12-20 LAB — RAD ONC ARIA SESSION SUMMARY
Course Elapsed Days: 15
Plan Fractions Treated to Date: 4
Plan Prescribed Dose Per Fraction: 2 Gy
Plan Total Fractions Prescribed: 18
Plan Total Prescribed Dose: 36 Gy
Reference Point Dosage Given to Date: 22 Gy
Reference Point Session Dosage Given: 2 Gy
Session Number: 11

## 2021-12-21 ENCOUNTER — Ambulatory Visit
Admission: RE | Admit: 2021-12-21 | Discharge: 2021-12-21 | Disposition: A | Discharge status: Home / Self Care | Payer: BC Managed Care – PPO | Source: Ambulatory Visit | Attending: Radiation Oncology | Admitting: Radiation Oncology

## 2021-12-21 ENCOUNTER — Other Ambulatory Visit: Payer: Self-pay

## 2021-12-21 DIAGNOSIS — C480 Malignant neoplasm of retroperitoneum: Secondary | ICD-10-CM | POA: Diagnosis not present

## 2021-12-21 DIAGNOSIS — Z510 Encounter for antineoplastic radiation therapy: Secondary | ICD-10-CM | POA: Diagnosis not present

## 2021-12-21 LAB — RAD ONC ARIA SESSION SUMMARY
Course Elapsed Days: 16
Plan Fractions Treated to Date: 5
Plan Prescribed Dose Per Fraction: 2 Gy
Plan Total Fractions Prescribed: 18
Plan Total Prescribed Dose: 36 Gy
Reference Point Dosage Given to Date: 24 Gy
Reference Point Session Dosage Given: 2 Gy
Session Number: 12

## 2021-12-24 ENCOUNTER — Other Ambulatory Visit: Payer: Self-pay

## 2021-12-24 ENCOUNTER — Ambulatory Visit
Admission: RE | Admit: 2021-12-24 | Discharge: 2021-12-24 | Disposition: A | Discharge status: Home / Self Care | Payer: BC Managed Care – PPO | Source: Ambulatory Visit | Attending: Radiation Oncology | Admitting: Radiation Oncology

## 2021-12-24 DIAGNOSIS — C480 Malignant neoplasm of retroperitoneum: Secondary | ICD-10-CM | POA: Diagnosis not present

## 2021-12-24 DIAGNOSIS — Z510 Encounter for antineoplastic radiation therapy: Secondary | ICD-10-CM | POA: Diagnosis not present

## 2021-12-24 LAB — RAD ONC ARIA SESSION SUMMARY
Course Elapsed Days: 19
Plan Fractions Treated to Date: 6
Plan Prescribed Dose Per Fraction: 2 Gy
Plan Total Fractions Prescribed: 18
Plan Total Prescribed Dose: 36 Gy
Reference Point Dosage Given to Date: 26 Gy
Reference Point Session Dosage Given: 2 Gy
Session Number: 13

## 2021-12-25 ENCOUNTER — Ambulatory Visit
Admission: RE | Admit: 2021-12-25 | Discharge: 2021-12-25 | Disposition: A | Discharge status: Home / Self Care | Payer: BC Managed Care – PPO | Source: Ambulatory Visit | Attending: Radiation Oncology | Admitting: Radiation Oncology

## 2021-12-25 ENCOUNTER — Other Ambulatory Visit: Payer: Self-pay

## 2021-12-25 DIAGNOSIS — C480 Malignant neoplasm of retroperitoneum: Secondary | ICD-10-CM | POA: Diagnosis not present

## 2021-12-25 DIAGNOSIS — Z510 Encounter for antineoplastic radiation therapy: Secondary | ICD-10-CM | POA: Diagnosis not present

## 2021-12-25 LAB — RAD ONC ARIA SESSION SUMMARY
Course Elapsed Days: 20
Plan Fractions Treated to Date: 7
Plan Prescribed Dose Per Fraction: 2 Gy
Plan Total Fractions Prescribed: 18
Plan Total Prescribed Dose: 36 Gy
Reference Point Dosage Given to Date: 28 Gy
Reference Point Session Dosage Given: 2 Gy
Session Number: 14

## 2021-12-26 ENCOUNTER — Other Ambulatory Visit: Payer: Self-pay

## 2021-12-26 ENCOUNTER — Ambulatory Visit
Admission: RE | Admit: 2021-12-26 | Discharge: 2021-12-26 | Disposition: A | Discharge status: Home / Self Care | Payer: BC Managed Care – PPO | Source: Ambulatory Visit | Attending: Radiation Oncology | Admitting: Radiation Oncology

## 2021-12-26 DIAGNOSIS — Z510 Encounter for antineoplastic radiation therapy: Secondary | ICD-10-CM | POA: Diagnosis not present

## 2021-12-26 DIAGNOSIS — C480 Malignant neoplasm of retroperitoneum: Secondary | ICD-10-CM | POA: Diagnosis not present

## 2021-12-26 LAB — RAD ONC ARIA SESSION SUMMARY
Course Elapsed Days: 21
Plan Fractions Treated to Date: 8
Plan Prescribed Dose Per Fraction: 2 Gy
Plan Total Fractions Prescribed: 18
Plan Total Prescribed Dose: 36 Gy
Reference Point Dosage Given to Date: 30 Gy
Reference Point Session Dosage Given: 2 Gy
Session Number: 15

## 2021-12-27 ENCOUNTER — Other Ambulatory Visit: Payer: Self-pay

## 2021-12-27 ENCOUNTER — Ambulatory Visit
Admission: RE | Admit: 2021-12-27 | Discharge: 2021-12-27 | Disposition: A | Discharge status: Home / Self Care | Payer: BC Managed Care – PPO | Source: Ambulatory Visit | Attending: Radiation Oncology | Admitting: Radiation Oncology

## 2021-12-27 DIAGNOSIS — C480 Malignant neoplasm of retroperitoneum: Secondary | ICD-10-CM | POA: Diagnosis not present

## 2021-12-27 DIAGNOSIS — Z510 Encounter for antineoplastic radiation therapy: Secondary | ICD-10-CM | POA: Diagnosis not present

## 2021-12-27 LAB — RAD ONC ARIA SESSION SUMMARY
Course Elapsed Days: 22
Plan Fractions Treated to Date: 9
Plan Prescribed Dose Per Fraction: 2 Gy
Plan Total Fractions Prescribed: 18
Plan Total Prescribed Dose: 36 Gy
Reference Point Dosage Given to Date: 32 Gy
Reference Point Session Dosage Given: 2 Gy
Session Number: 16

## 2021-12-28 ENCOUNTER — Other Ambulatory Visit: Payer: Self-pay

## 2021-12-28 ENCOUNTER — Ambulatory Visit
Admission: RE | Admit: 2021-12-28 | Discharge: 2021-12-28 | Disposition: A | Discharge status: Home / Self Care | Payer: BC Managed Care – PPO | Source: Ambulatory Visit | Attending: Radiation Oncology | Admitting: Radiation Oncology

## 2021-12-28 DIAGNOSIS — Z510 Encounter for antineoplastic radiation therapy: Secondary | ICD-10-CM | POA: Diagnosis not present

## 2021-12-28 DIAGNOSIS — C480 Malignant neoplasm of retroperitoneum: Secondary | ICD-10-CM | POA: Diagnosis not present

## 2021-12-28 LAB — RAD ONC ARIA SESSION SUMMARY
Course Elapsed Days: 23
Plan Fractions Treated to Date: 10
Plan Prescribed Dose Per Fraction: 2 Gy
Plan Total Fractions Prescribed: 18
Plan Total Prescribed Dose: 36 Gy
Reference Point Dosage Given to Date: 34 Gy
Reference Point Session Dosage Given: 2 Gy
Session Number: 17

## 2021-12-31 ENCOUNTER — Other Ambulatory Visit: Payer: Self-pay

## 2021-12-31 ENCOUNTER — Ambulatory Visit
Admission: RE | Admit: 2021-12-31 | Discharge: 2021-12-31 | Disposition: A | Discharge status: Home / Self Care | Payer: BC Managed Care – PPO | Source: Ambulatory Visit | Attending: Radiation Oncology | Admitting: Radiation Oncology

## 2021-12-31 DIAGNOSIS — Z510 Encounter for antineoplastic radiation therapy: Secondary | ICD-10-CM | POA: Diagnosis not present

## 2021-12-31 DIAGNOSIS — C480 Malignant neoplasm of retroperitoneum: Secondary | ICD-10-CM | POA: Diagnosis not present

## 2021-12-31 LAB — RAD ONC ARIA SESSION SUMMARY
Course Elapsed Days: 26
Plan Fractions Treated to Date: 11
Plan Prescribed Dose Per Fraction: 2 Gy
Plan Total Fractions Prescribed: 18
Plan Total Prescribed Dose: 36 Gy
Reference Point Dosage Given to Date: 36 Gy
Reference Point Session Dosage Given: 2 Gy
Session Number: 18

## 2022-01-01 ENCOUNTER — Ambulatory Visit
Admission: RE | Admit: 2022-01-01 | Discharge: 2022-01-01 | Disposition: A | Discharge status: Home / Self Care | Payer: BC Managed Care – PPO | Source: Ambulatory Visit | Attending: Radiation Oncology | Admitting: Radiation Oncology

## 2022-01-01 ENCOUNTER — Other Ambulatory Visit: Payer: Self-pay

## 2022-01-01 DIAGNOSIS — C480 Malignant neoplasm of retroperitoneum: Secondary | ICD-10-CM | POA: Diagnosis not present

## 2022-01-01 DIAGNOSIS — Z510 Encounter for antineoplastic radiation therapy: Secondary | ICD-10-CM | POA: Diagnosis not present

## 2022-01-01 LAB — RAD ONC ARIA SESSION SUMMARY
Course Elapsed Days: 27
Plan Fractions Treated to Date: 12
Plan Prescribed Dose Per Fraction: 2 Gy
Plan Total Fractions Prescribed: 18
Plan Total Prescribed Dose: 36 Gy
Reference Point Dosage Given to Date: 38 Gy
Reference Point Session Dosage Given: 2 Gy
Session Number: 19

## 2022-01-01 NOTE — Therapy (Deleted)
OUTPATIENT PHYSICAL THERAPY MALE PELVIC EVALUATION   Patient Name: Keith Riley MRN: [id number] DOB:[birth year], 71 y.o., male Today's Date: 01/01/2022    Past Medical History:  Diagnosis Date   Hypertension    No past surgical history on file. Patient Active Problem List   Diagnosis Date Noted   Liposarcoma of retroperitoneum ([place]) 11/22/2021    PCP: None  REFERRING PROVIDER: Hayden Pedro, PA-C  REFERRING DIAG: C48.0 (ICD-10-CM) - Liposarcoma of retroperitoneum ([place])  THERAPY DIAG:  No diagnosis found.  Rationale for Evaluation and Treatment Rehabilitation  ONSET DATE: ***  SUBJECTIVE:                                                                                                                                                                                           SUBJECTIVE STATEMENT: *** Fluid intake: ***   PAIN:  Are you having pain? {yes/no:20286} NPRS scale: ***/10 Pain location: {pelvic pain location:27098}  Pain type: {type:313116} Pain description: {PAIN DESCRIPTION:21022940}   Aggravating factors: *** Relieving factors: ***  PRECAUTIONS: Other: cancer with radiation  WEIGHT BEARING RESTRICTIONS {Yes ***/No:24003}  FALLS:  Has patient fallen in last 6 months? {fallsyesno:27318}  LIVING ENVIRONMENT: Lives with: lives with their family Lives in: House/apartment Stairs: Yes: Internal: 12 steps; can reach both Has following equipment at home: None  OCCUPATION: school bus driver  PLOF: Independent  PATIENT GOALS ***  PERTINENT HISTORY:  retroperitoneal liposarcoma ; history of prostate cancer 12/2020 Sexual abuse: {Yes/No}  BOWEL MOVEMENT Pain with bowel movement: {yes/no:20286} Type of bowel movement:{PT BM type:27100} Fully empty rectum: {Yes/No:304960894} Leakage: {Yes/No:304960894} Pads: {Yes/No:304960894} Fiber supplement: {Yes/No:304960894}  URINATION Pain with urination: {yes/no:20286} Fully empty bladder:  {Yes/No:304960894} Stream: {PT urination:27102} Urgency: {Yes/No:304960894} Frequency: *** Leakage: {PT leakage:27103} Pads: {Yes/No:304960894}  INTERCOURSE Pain with intercourse: {pain with intercourse PA:27099} Climax: *** Ejaculation: {Yes/No:304960894}     OBJECTIVE:   DIAGNOSTIC FINDINGS:  Physical Therapy Prehab Assessment, at risk of pelvic floor dysfunction, changes in ROM, and lymphedema from radiation to his right pelvic sidewall.  PATIENT SURVEYS:  {rehab surveys:24030}  PFIQ-7 ***  COGNITION:  Overall cognitive status: {cognition:24006}     SENSATION:  Light touch: {intact/deficits:24005}  Proprioception: {intact/deficits:24005}  MUSCLE LENGTH: Hamstrings: Right *** deg; Left *** deg Thomas test: Right *** deg; Left *** deg  LUMBAR SPECIAL TESTS:  {lumbar special test:25242}  FUNCTIONAL TESTS:  {Functional tests:24029}  GAIT: Distance walked: *** Assistive device utilized: {Assistive devices:23999} Level of assistance: {Levels of assistance:24026} Comments: ***   POSTURE: {posture:25561}   PELVIC ALIGNMENT:  LUMBARAROM/PROM  A/PROM A/PROM  eval  Flexion   Extension   Right lateral flexion  Left lateral flexion   Right rotation   Left rotation    (Blank rows = not tested)  LOWER EXTREMITY AROM/PROM:  A/PROM Right eval Left eval  Hip flexion    Hip extension    Hip abduction    Hip adduction    Hip internal rotation    Hip external rotation    Knee flexion    Knee extension    Ankle dorsiflexion    Ankle plantarflexion    Ankle inversion    Ankle eversion     (Blank rows = not tested)  LOWER EXTREMITY MMT:  MMT Right eval Left eval  Hip flexion    Hip extension    Hip abduction    Hip adduction    Hip internal rotation    Hip external rotation    Knee flexion    Knee extension    Ankle dorsiflexion    Ankle plantarflexion    Ankle inversion    Ankle eversion     PALPATION: GENERAL ***               External Perineal Exam ***              Internal Pelvic Floor *** Patient confirms identification and approves PT to assess internal pelvic floor and treatment {yes/no:20286}  PELVIC MMT:   MMT eval  Internal Anal Sphincter   External Anal Sphincter   Puborectalis   Diastasis Recti   (Blank rows = not tested)   TONE: ***  TODAY'S TREATMENT  EVAL ***   PATIENT EDUCATION:  Education details: *** Person educated: {Person educated:25204} Education method: {Education Method:25205} Education comprehension: {Education Comprehension:25206}   HOME EXERCISE PROGRAM: ***  ASSESSMENT:  CLINICAL IMPRESSION: Patient is a *** y.o. *** who was seen today for physical therapy evaluation and treatment for ***.    OBJECTIVE IMPAIRMENTS {opptimpairments:25111}.   ACTIVITY LIMITATIONS {activitylimitations:27494}  PARTICIPATION LIMITATIONS: {participationrestrictions:25113}  PERSONAL FACTORS {Personal factors:25162} are also affecting patient's functional outcome.   REHAB POTENTIAL: {rehabpotential:25112}  CLINICAL DECISION MAKING: {clinical decision making:25114}  EVALUATION COMPLEXITY: {Evaluation complexity:25115}   GOALS: Goals reviewed with patient? {yes/no:20286}  SHORT TERM GOALS: Target date: {follow up:25551}   *** Baseline: Goal status: {GOALSTATUS:25110}  2.  *** Baseline:  Goal status: {GOALSTATUS:25110}  3.  *** Baseline:  Goal status: {GOALSTATUS:25110}  4.  *** Baseline:  Goal status: {GOALSTATUS:25110}  5.  *** Baseline:  Goal status: {GOALSTATUS:25110}  6.  *** Baseline:  Goal status: {GOALSTATUS:25110}  LONG TERM GOALS: Target date: {follow up:25551}   *** Baseline:  Goal status: {GOALSTATUS:25110}  2.  *** Baseline:  Goal status: {GOALSTATUS:25110}  3.  *** Baseline:  Goal status: {GOALSTATUS:25110}  4.  *** Baseline:  Goal status: {GOALSTATUS:25110}  5.  *** Baseline:  Goal status: {GOALSTATUS:25110}  6.   *** Baseline:  Goal status: {GOALSTATUS:25110}   PLAN: PT FREQUENCY: {rehab frequency:25116}  PT DURATION: {rehab duration:25117}  PLANNED INTERVENTIONS: {rehab planned interventions:25118::"Therapeutic exercises","Therapeutic activity","Neuromuscular re-education","Balance training","Gait training","Patient/Family education","Self Care","Joint mobilization"}  PLAN FOR NEXT SESSION: ***   Anise Harbin, PT 01/01/2022, 8:18 AM

## 2022-01-02 ENCOUNTER — Ambulatory Visit
Admission: RE | Admit: 2022-01-02 | Discharge: 2022-01-02 | Disposition: A | Discharge status: Home / Self Care | Payer: BC Managed Care – PPO | Source: Ambulatory Visit | Attending: Radiation Oncology | Admitting: Radiation Oncology

## 2022-01-02 ENCOUNTER — Other Ambulatory Visit: Payer: Self-pay

## 2022-01-02 ENCOUNTER — Ambulatory Visit: Payer: Medicare PPO | Attending: Adult Health | Admitting: Physical Therapy

## 2022-01-02 DIAGNOSIS — C480 Malignant neoplasm of retroperitoneum: Secondary | ICD-10-CM | POA: Diagnosis not present

## 2022-01-02 DIAGNOSIS — Z510 Encounter for antineoplastic radiation therapy: Secondary | ICD-10-CM | POA: Diagnosis not present

## 2022-01-02 LAB — RAD ONC ARIA SESSION SUMMARY
Course Elapsed Days: 28
Plan Fractions Treated to Date: 13
Plan Prescribed Dose Per Fraction: 2 Gy
Plan Total Fractions Prescribed: 18
Plan Total Prescribed Dose: 36 Gy
Reference Point Dosage Given to Date: 40 Gy
Reference Point Session Dosage Given: 2 Gy
Session Number: 20

## 2022-01-03 ENCOUNTER — Other Ambulatory Visit: Payer: Self-pay

## 2022-01-03 ENCOUNTER — Ambulatory Visit
Admission: RE | Admit: 2022-01-03 | Discharge: 2022-01-03 | Disposition: A | Discharge status: Home / Self Care | Payer: BC Managed Care – PPO | Source: Ambulatory Visit | Attending: Radiation Oncology | Admitting: Radiation Oncology

## 2022-01-03 DIAGNOSIS — Z510 Encounter for antineoplastic radiation therapy: Secondary | ICD-10-CM | POA: Diagnosis not present

## 2022-01-03 DIAGNOSIS — C480 Malignant neoplasm of retroperitoneum: Secondary | ICD-10-CM | POA: Diagnosis not present

## 2022-01-03 LAB — RAD ONC ARIA SESSION SUMMARY
Course Elapsed Days: 29
Plan Fractions Treated to Date: 14
Plan Prescribed Dose Per Fraction: 2 Gy
Plan Total Fractions Prescribed: 18
Plan Total Prescribed Dose: 36 Gy
Reference Point Dosage Given to Date: 42 Gy
Reference Point Session Dosage Given: 2 Gy
Session Number: 21

## 2022-01-04 ENCOUNTER — Ambulatory Visit
Admission: RE | Admit: 2022-01-04 | Discharge: 2022-01-04 | Disposition: A | Discharge status: Home / Self Care | Payer: BC Managed Care – PPO | Source: Ambulatory Visit | Attending: Radiation Oncology | Admitting: Radiation Oncology

## 2022-01-04 ENCOUNTER — Other Ambulatory Visit: Payer: Self-pay

## 2022-01-04 DIAGNOSIS — Z510 Encounter for antineoplastic radiation therapy: Secondary | ICD-10-CM | POA: Diagnosis not present

## 2022-01-04 DIAGNOSIS — C480 Malignant neoplasm of retroperitoneum: Secondary | ICD-10-CM | POA: Diagnosis not present

## 2022-01-04 LAB — RAD ONC ARIA SESSION SUMMARY
Course Elapsed Days: 30
Plan Fractions Treated to Date: 15
Plan Prescribed Dose Per Fraction: 2 Gy
Plan Total Fractions Prescribed: 18
Plan Total Prescribed Dose: 36 Gy
Reference Point Dosage Given to Date: 44 Gy
Reference Point Session Dosage Given: 2 Gy
Session Number: 22

## 2022-01-07 ENCOUNTER — Other Ambulatory Visit: Payer: Self-pay

## 2022-01-07 ENCOUNTER — Ambulatory Visit
Admission: RE | Admit: 2022-01-07 | Discharge: 2022-01-07 | Disposition: A | Discharge status: Home / Self Care | Payer: BC Managed Care – PPO | Source: Ambulatory Visit | Attending: Radiation Oncology | Admitting: Radiation Oncology

## 2022-01-07 DIAGNOSIS — C480 Malignant neoplasm of retroperitoneum: Secondary | ICD-10-CM | POA: Diagnosis not present

## 2022-01-07 DIAGNOSIS — Z510 Encounter for antineoplastic radiation therapy: Secondary | ICD-10-CM | POA: Diagnosis not present

## 2022-01-07 LAB — RAD ONC ARIA SESSION SUMMARY
Course Elapsed Days: 33
Plan Fractions Treated to Date: 16
Plan Prescribed Dose Per Fraction: 2 Gy
Plan Total Fractions Prescribed: 18
Plan Total Prescribed Dose: 36 Gy
Reference Point Dosage Given to Date: 46 Gy
Reference Point Session Dosage Given: 2 Gy
Session Number: 23

## 2022-01-08 ENCOUNTER — Other Ambulatory Visit: Payer: Self-pay

## 2022-01-08 ENCOUNTER — Ambulatory Visit
Admission: RE | Admit: 2022-01-08 | Discharge: 2022-01-08 | Disposition: A | Discharge status: Home / Self Care | Payer: BC Managed Care – PPO | Source: Ambulatory Visit | Attending: Radiation Oncology | Admitting: Radiation Oncology

## 2022-01-08 DIAGNOSIS — Z510 Encounter for antineoplastic radiation therapy: Secondary | ICD-10-CM | POA: Diagnosis not present

## 2022-01-08 DIAGNOSIS — C61 Malignant neoplasm of prostate: Secondary | ICD-10-CM | POA: Diagnosis not present

## 2022-01-08 DIAGNOSIS — C480 Malignant neoplasm of retroperitoneum: Secondary | ICD-10-CM | POA: Diagnosis not present

## 2022-01-08 LAB — RAD ONC ARIA SESSION SUMMARY
Course Elapsed Days: 34
Plan Fractions Treated to Date: 17
Plan Prescribed Dose Per Fraction: 2 Gy
Plan Total Fractions Prescribed: 18
Plan Total Prescribed Dose: 36 Gy
Reference Point Dosage Given to Date: 48 Gy
Reference Point Session Dosage Given: 2 Gy
Session Number: 24

## 2022-01-09 ENCOUNTER — Other Ambulatory Visit: Payer: Self-pay

## 2022-01-09 ENCOUNTER — Encounter: Payer: Self-pay | Admitting: Radiation Oncology

## 2022-01-09 ENCOUNTER — Ambulatory Visit
Admission: RE | Admit: 2022-01-09 | Discharge: 2022-01-09 | Disposition: A | Discharge status: Home / Self Care | Payer: BC Managed Care – PPO | Source: Ambulatory Visit | Attending: Radiation Oncology | Admitting: Radiation Oncology

## 2022-01-09 DIAGNOSIS — Z510 Encounter for antineoplastic radiation therapy: Secondary | ICD-10-CM | POA: Diagnosis not present

## 2022-01-09 DIAGNOSIS — C480 Malignant neoplasm of retroperitoneum: Secondary | ICD-10-CM | POA: Diagnosis not present

## 2022-01-09 LAB — RAD ONC ARIA SESSION SUMMARY
Course Elapsed Days: 35
Plan Fractions Treated to Date: 18
Plan Prescribed Dose Per Fraction: 2 Gy
Plan Total Fractions Prescribed: 18
Plan Total Prescribed Dose: 36 Gy
Reference Point Dosage Given to Date: 50 Gy
Reference Point Session Dosage Given: 2 Gy
Session Number: 25

## 2022-01-11 ENCOUNTER — Ambulatory Visit (INDEPENDENT_AMBULATORY_CARE_PROVIDER_SITE_OTHER): Payer: BC Managed Care – PPO | Admitting: Podiatry

## 2022-01-11 ENCOUNTER — Ambulatory Visit: Payer: Medicare PPO | Admitting: Podiatry

## 2022-01-11 DIAGNOSIS — L600 Ingrowing nail: Secondary | ICD-10-CM | POA: Diagnosis not present

## 2022-01-11 DIAGNOSIS — B351 Tinea unguium: Secondary | ICD-10-CM

## 2022-01-11 NOTE — Patient Instructions (Signed)

## 2022-01-11 NOTE — Progress Notes (Signed)
.  tfc  
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
## 2022-01-22 NOTE — Progress Notes (Signed)
                                                                                                                                                             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 He developed fatigue and some loose stools at the conclusion of radiation.  Plan: The patient will receive a call in about one month from the radiation oncology department. He will continue follow up with Dr. Maudie Mercury, Dr. Sherlynn Stalls, and Dr. Ashby Dawes at [facility]. ________________________________________________    Carola Rhine, [facility]

## 2022-02-18 ENCOUNTER — Ambulatory Visit
Admission: RE | Admit: 2022-02-18 | Discharge: 2022-02-18 | Disposition: A | Discharge status: Home / Self Care | Payer: Medicare PPO | Source: Ambulatory Visit | Attending: Radiation Oncology | Admitting: Radiation Oncology

## 2022-02-18 NOTE — Progress Notes (Signed)
  Radiation Oncology         [phone] ________________________________  Name: Keith Riley MRN: [id number]  Date of Service: 02/18/2022  DOB: 11-12-1950  Post Treatment Telephone Note  Diagnosis:  Liposarcoma of the retroperitoneum   Intent: Curative  Radiation Treatment Dates: 12/05/2021 through 01/09/2022 Site Technique Total Dose (Gy) Dose per Fx (Gy) Completed Fx Beam Energies  Abdomen: Abd IMRT 50/50 2 25/25 10X  (as documented in provider EOT note).   The patient was NOT available for call today. A voicemail was left. Patient was notified to keep follow-up appointments.  The patient has scheduled follow up with his medical oncologist Dr. Maudie Mercury, Dr. Sherlynn Stalls, and Dr. Ashby Dawes at [facility]. for ongoing surveillance, and was encouraged to call if he develops concerns or questions regarding radiation.   Leandra Kern, LPN

## 2022-04-02 DIAGNOSIS — N5201 Erectile dysfunction due to arterial insufficiency: Secondary | ICD-10-CM | POA: Diagnosis not present

## 2022-04-02 DIAGNOSIS — C61 Malignant neoplasm of prostate: Secondary | ICD-10-CM | POA: Diagnosis not present

## 2022-07-31 IMAGING — MR MR PELVIS WO/W CM
5 of 9 series · 27 of 48 positions shown · IV contrast (20 mL Multihance)
Comparison: MR prostate dated July 14, 2021 and MR lumbar spine
dated [DATE] [DATE], [DATE] [DATE].

CLINICAL DATA: Deep pelvis soft tissue mass

EXAM:
MRI PELVIS WITHOUT AND WITH CONTRAST
TECHNIQUE: Multiplanar multisequence MR imaging of the pelvis was performed
both before and after administration of intravenous contrast.
CONTRAST:  19mL MULTIHANCE GADOBENATE DIMEGLUMINE 529 MG/ML IV SOLN

[Series 3: STIR · coronal · right · 3.0mm · 1.25mm/px · 5 of 50 slices shown]
[im 1/50]
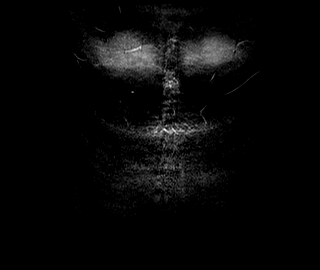
[im 13/50]
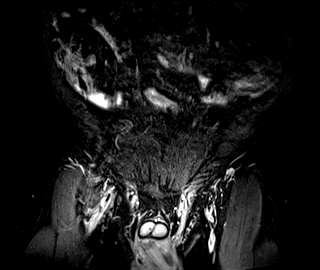
[im 25/50]
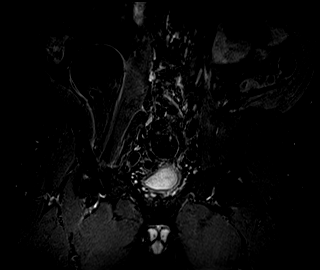
[im 37/50]
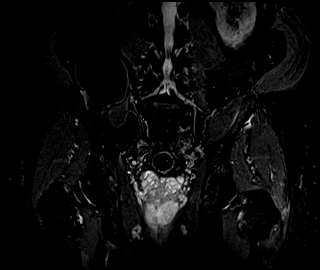
[im 50/50]
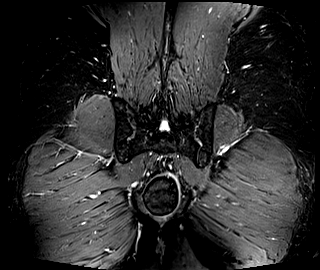

[Series 4: T1 · coronal · right · 3.0mm · 0.78mm/px · 6 of 50 slices shown (1 of 2)]
[im 1/50]
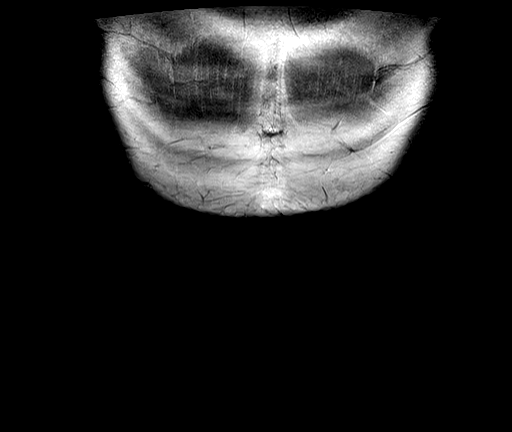
[im 10/50]
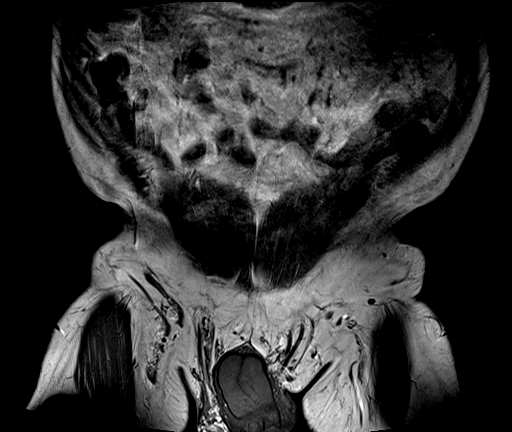
[im 20/50]
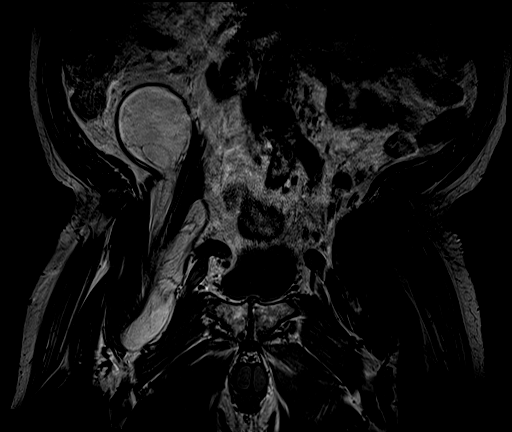
[im 30/50]
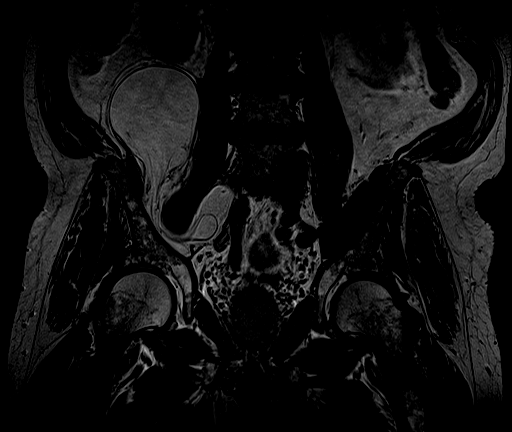
[im 40/50]
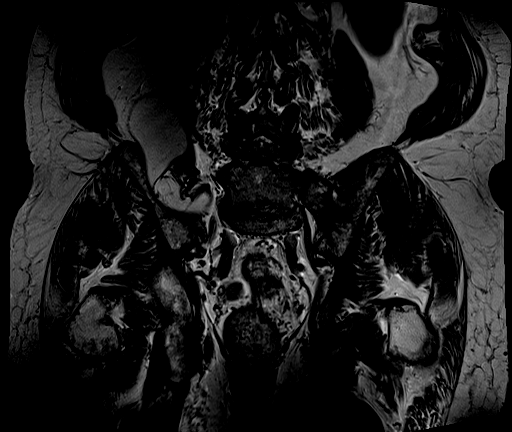
[im 50/50]
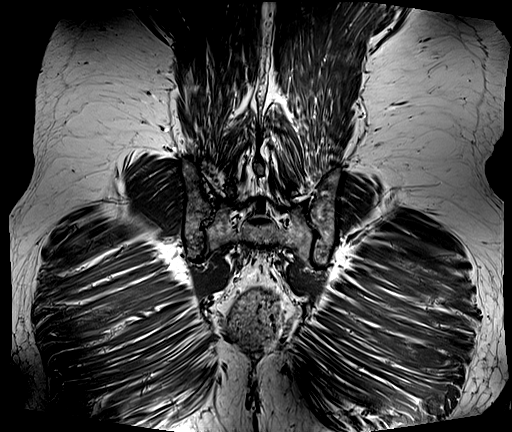

[Series 5: T1 · axial · right · 5.0mm · 0.78mm/px · z∈[-83,+267]mm · 6 of 51 slices shown (2 of 2)]
[im 1/51]
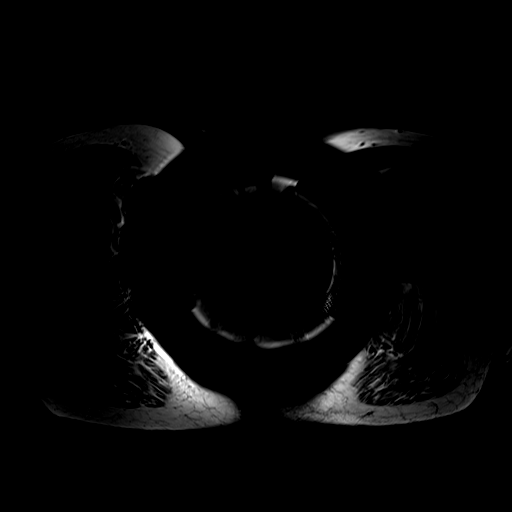
[im 11/51]
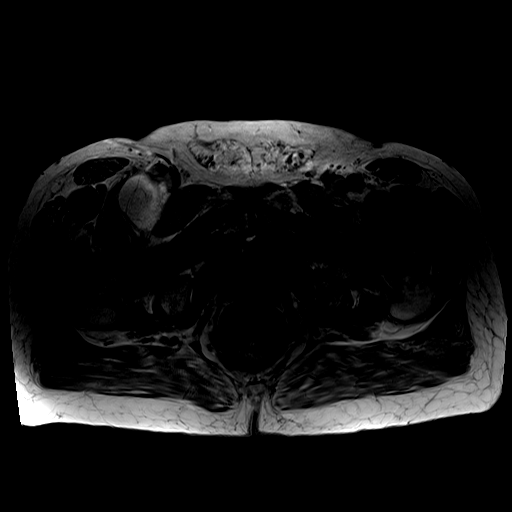
[im 21/51]
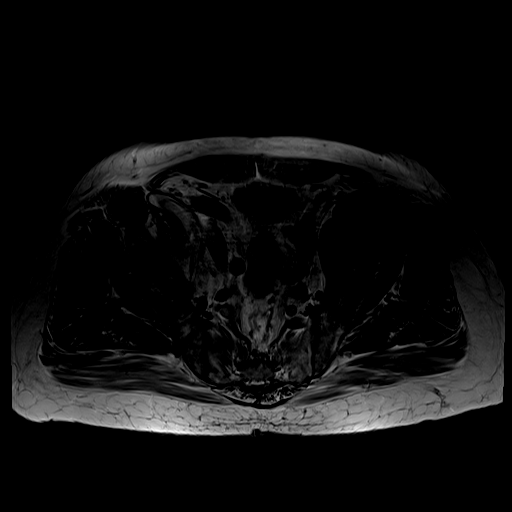
[im 31/51]
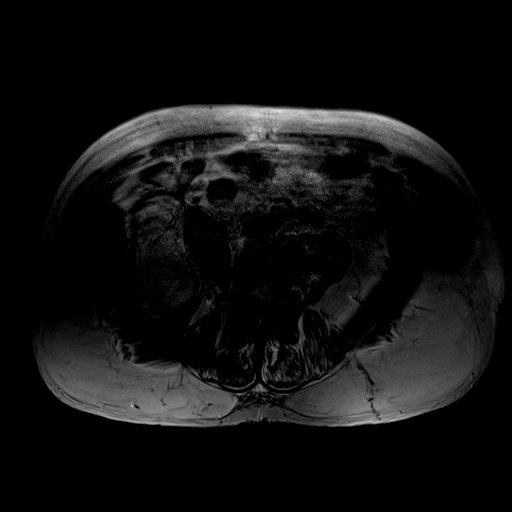
[im 41/51]
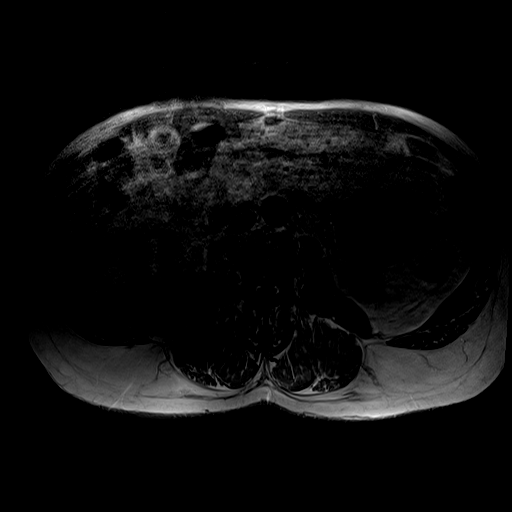
[im 51/51]
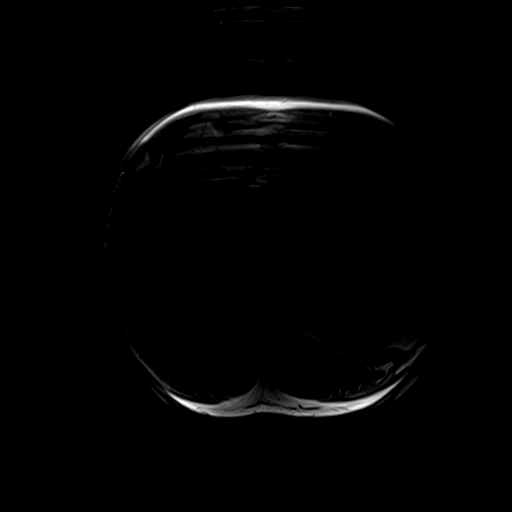

[Series 7: T2 fat-sat · sagittal · right · 5.0mm · 0.80mm/px · 5 of 45 slices shown]
[im 1/45]
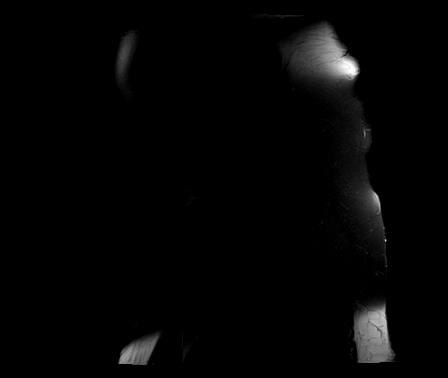
[im 12/45]
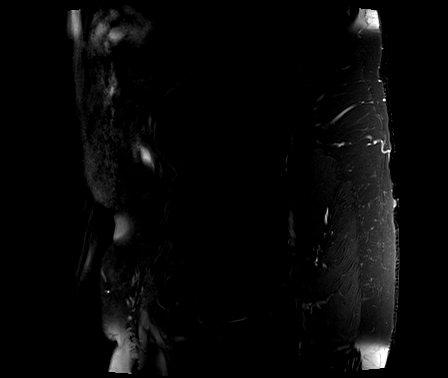
[im 23/45]
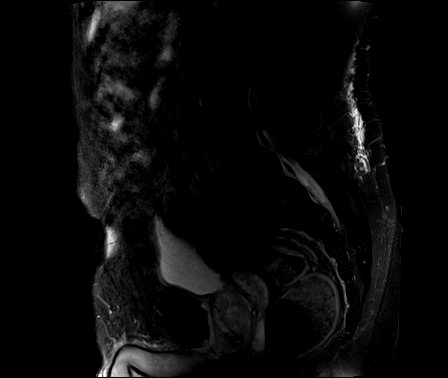
[im 34/45]
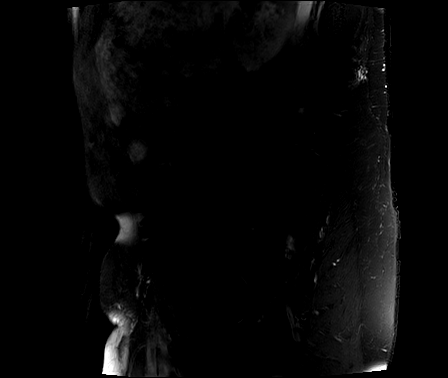
[im 45/45]
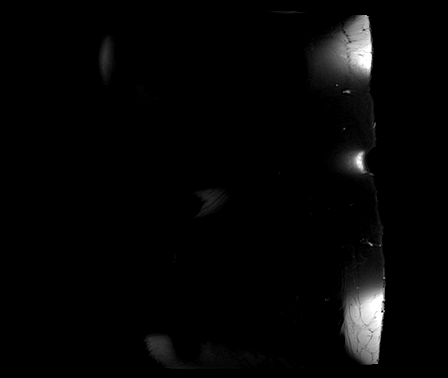

[Series 8: T1 fat-sat · axial · non-contrast · 5.0mm · 0.89mm/px · z∈[-39,+234]mm · 5 of 40 slices shown]
[im 1/40]
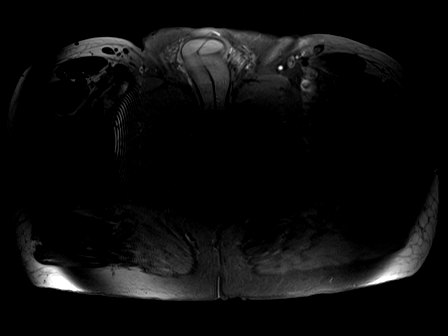
[im 10/40]
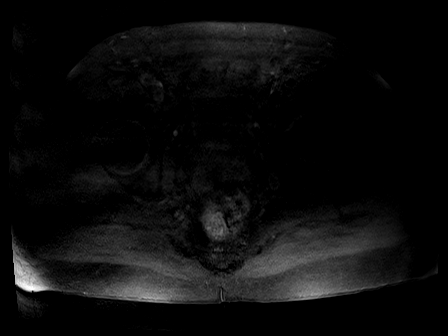
[im 20/40]
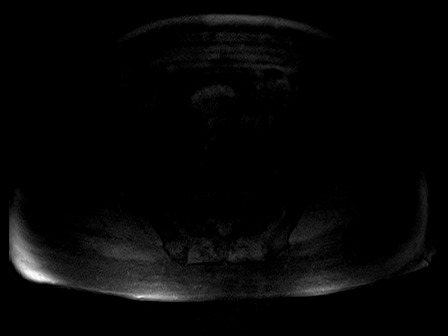
[im 30/40]
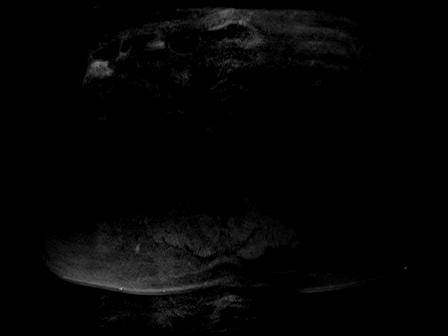
[im 40/40]
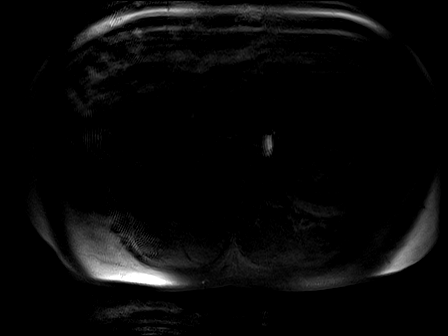

[27 of 48 positions shown; findings below may reference images not displayed]

FINDINGS: Urinary Tract:  No abnormality visualized.

Bowel:  Unremarkable visualized pelvic bowel loops.

Vascular/Lymphatic: No pathologically enlarged lymph nodes. No
significant vascular abnormality seen.

Reproductive: Prostate is mildly enlarged without significant
abnormality.

Other:  None.

Musculoskeletal: There is a deep pelvic fatty mass which appears to
be arising deep to the right iliacus muscle and extends superiorly
along the right of the psoas muscle to the inferior border of the
right kidney. The mass extends inferiorly along the iliopsoas tendon
and extends into the proximal thigh up to the level of the greater
trochanter. This mass measures at least 9.9 x 6.7 x 22.5 cm (AP by
TR by CC). There are few thin enhancing septations most prominent
about is origin deep to the iliacus muscle. There is no enhancing
soft tissue component other than the septations.
IMPRESSION: Large right deep pelvic fatty mass which appears to be arising deep
to the iliacus muscle and extends superiorly to the lower border of
the right kidney and inferiorly into the proximal right thigh
measuring at least 9.9 x 6.7 x 22.5 cm with thin enhancing
septations. This most likely represents atypical lipomatous tumor
(well-differentiated liposarcoma), differential includes other
lipomatous tumor. Consultation with orthopedic oncology and further
evaluation with tissue diagnosis would be helpful.

## 2022-12-13 ENCOUNTER — Emergency Department (HOSPITAL_COMMUNITY): Payer: BC Managed Care – PPO

## 2022-12-13 ENCOUNTER — Emergency Department (HOSPITAL_COMMUNITY)
Admission: EM | Admit: 2022-12-13 | Discharge: 2022-12-13 | Disposition: A | Discharge status: Home / Self Care | Payer: BC Managed Care – PPO | Attending: Emergency Medicine | Admitting: Emergency Medicine

## 2022-12-13 ENCOUNTER — Inpatient Hospital Stay: Admit: 2022-12-13 | Payer: Medicare PPO | Admitting: Orthopedic Surgery

## 2022-12-13 ENCOUNTER — Other Ambulatory Visit: Payer: Self-pay

## 2022-12-13 DIAGNOSIS — S61402A Unspecified open wound of left hand, initial encounter: Secondary | ICD-10-CM | POA: Diagnosis not present

## 2022-12-13 DIAGNOSIS — Z23 Encounter for immunization: Secondary | ICD-10-CM | POA: Insufficient documentation

## 2022-12-13 DIAGNOSIS — W320XXA Accidental handgun discharge, initial encounter: Secondary | ICD-10-CM | POA: Diagnosis not present

## 2022-12-13 DIAGNOSIS — W3400XA Accidental discharge from unspecified firearms or gun, initial encounter: Secondary | ICD-10-CM

## 2022-12-13 LAB — CBC WITH DIFFERENTIAL/PLATELET
Abs Immature Granulocytes: 0.02 10*3/uL (ref 0.00–0.07)
Basophils Absolute: 0 10*3/uL (ref 0.0–0.1)
Basophils Relative: 1 %
Eosinophils Absolute: 0 10*3/uL (ref 0.0–0.5)
Eosinophils Relative: 1 %
HCT: 36.6 % — ABNORMAL LOW (ref 39.0–52.0)
Hemoglobin: 13 g/dL (ref 13.0–17.0)
Immature Granulocytes: 1 %
Lymphocytes Relative: 27 %
Lymphs Abs: 0.8 10*3/uL (ref 0.7–4.0)
MCH: 32.1 pg (ref 26.0–34.0)
MCHC: 35.5 g/dL (ref 30.0–36.0)
MCV: 90.4 fL (ref 80.0–100.0)
Monocytes Absolute: 0.3 10*3/uL (ref 0.1–1.0)
Monocytes Relative: 11 %
Neutro Abs: 1.8 10*3/uL (ref 1.7–7.7)
Neutrophils Relative %: 59 %
Platelets: 164 10*3/uL (ref 150–400)
RBC: 4.05 MIL/uL — ABNORMAL LOW (ref 4.22–5.81)
RDW: 13.1 % (ref 11.5–15.5)
WBC: 3 10*3/uL — ABNORMAL LOW (ref 4.0–10.5)
nRBC: 0 % (ref 0.0–0.2)

## 2022-12-13 LAB — I-STAT CHEM 8, ED
BUN: 18 mg/dL (ref 8–23)
Calcium, Ion: 1.1 mmol/L — ABNORMAL LOW (ref 1.15–1.40)
Chloride: 107 mmol/L (ref 98–111)
Creatinine, Ser: 1.1 mg/dL (ref 0.61–1.24)
Glucose, Bld: 161 mg/dL — ABNORMAL HIGH (ref 70–99)
HCT: 37 % — ABNORMAL LOW (ref 39.0–52.0)
Hemoglobin: 12.6 g/dL — ABNORMAL LOW (ref 13.0–17.0)
Potassium: 3.8 mmol/L (ref 3.5–5.1)
Sodium: 141 mmol/L (ref 135–145)
TCO2: 23 mmol/L (ref 22–32)

## 2022-12-13 SURGERY — IRRIGATION AND DEBRIDEMENT EXTREMITY
Anesthesia: General | Laterality: Left

## 2022-12-13 MED ORDER — ACETAMINOPHEN 325 MG PO TABS: 650.0000 mg | ORAL_TABLET | Freq: Four times a day (QID) | ORAL | 0 refills | Status: AC | PRN

## 2022-12-13 MED ORDER — IBUPROFEN 600 MG PO TABS: 600.0000 mg | ORAL_TABLET | Freq: Four times a day (QID) | ORAL | 0 refills | Status: AC | PRN

## 2022-12-13 MED ORDER — TETANUS-DIPHTH-ACELL PERTUSSIS 5-2.5-18.5 LF-MCG/0.5 IM SUSY
0.5000 mL | PREFILLED_SYRINGE | Freq: Once | INTRAMUSCULAR | Status: AC
  Administered 2022-12-13: 0.5 mL via INTRAMUSCULAR
  Filled 2022-12-13: qty 0.5

## 2022-12-13 MED ORDER — MORPHINE SULFATE (PF) 4 MG/ML IV SOLN
4.0000 mg | Freq: Once | INTRAVENOUS | Status: AC
  Administered 2022-12-13: 4 mg via INTRAVENOUS
  Filled 2022-12-13: qty 1

## 2022-12-13 MED ORDER — LIDOCAINE-EPINEPHRINE (PF) 2 %-1:200000 IJ SOLN
20.0000 mL | Freq: Once | INTRAMUSCULAR | Status: AC
  Administered 2022-12-13: 20 mL
  Filled 2022-12-13: qty 20

## 2022-12-13 MED ORDER — OXYCODONE HCL 5 MG PO TABS: 5.0000 mg | ORAL_TABLET | ORAL | 0 refills | Status: DC | PRN

## 2022-12-13 MED ORDER — SODIUM CHLORIDE 0.9 % IV SOLN: INTRAVENOUS | Status: DC

## 2022-12-13 MED ORDER — FENTANYL CITRATE PF 50 MCG/ML IJ SOSY
50.0000 ug | PREFILLED_SYRINGE | Freq: Once | INTRAMUSCULAR | Status: AC
  Administered 2022-12-13: 50 ug via INTRAVENOUS
  Filled 2022-12-13: qty 1

## 2022-12-13 MED ORDER — MORPHINE SULFATE (PF) 2 MG/ML IV SOLN
2.0000 mg | Freq: Once | INTRAVENOUS | Status: AC
  Administered 2022-12-13: 2 mg via INTRAVENOUS
  Filled 2022-12-13: qty 1

## 2022-12-13 MED ORDER — CEFAZOLIN SODIUM-DEXTROSE 2-4 GM/100ML-% IV SOLN
2.0000 g | Freq: Once | INTRAVENOUS | Status: AC
  Administered 2022-12-13: 2 g via INTRAVENOUS
  Filled 2022-12-13: qty 100

## 2022-12-13 MED ORDER — CEFAZOLIN SODIUM-DEXTROSE 2-4 GM/100ML-% IV SOLN
2.0000 g | Freq: Three times a day (TID) | INTRAVENOUS | Status: AC
  Administered 2022-12-13: 2 g via INTRAVENOUS
  Filled 2022-12-13: qty 100

## 2022-12-13 MED ORDER — CEFADROXIL 500 MG PO CAPS: 500.0000 mg | ORAL_CAPSULE | Freq: Two times a day (BID) | ORAL | 0 refills | Status: AC

## 2022-12-13 NOTE — ED Notes (Signed)
Nurse is aware of low HR @ 49/50
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
## 2022-12-13 NOTE — Progress Notes (Signed)
Orthopedic Tech Progress Note Patient Details:  Keith Riley Sep 03, 1950 [id number] Applied splint after Dr.Gray cleaned and dressed wound.  Ortho Devices Type of Ortho Device: Ulna gutter splint Ortho Device/Splint Location: LUE Ortho Device/Splint Interventions: Ordered, Application, Adjustment   Post Interventions Patient Tolerated: Well Instructions Provided: Care of device  Keith Riley 12/13/2022, 10:17 AM
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
## 2022-12-13 NOTE — Discharge Instructions (Addendum)
Please go directly to Dr Georgia Duff office for evaluation: [address] [place] [address] [place] [address] [phone]   Please keep hand wound clean. Do not submerge underwater such as in bathtub, hot tub, pool, or in sink while washing dishes. Use gentle soap and water twice daily to cleanse the wound. See orthopedics for further evaluation   It was a pleasure caring for you today in the emergency department.  Please return to the emergency department for any worsening or worrisome symptoms.

## 2022-12-17 ENCOUNTER — Other Ambulatory Visit: Payer: Self-pay

## 2022-12-17 ENCOUNTER — Encounter (HOSPITAL_COMMUNITY): Payer: Self-pay | Admitting: Orthopedic Surgery
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
## 2022-12-18 ENCOUNTER — Encounter (HOSPITAL_COMMUNITY): Payer: Self-pay | Admitting: Orthopedic Surgery

## 2022-12-18 ENCOUNTER — Ambulatory Visit (HOSPITAL_COMMUNITY): Payer: BC Managed Care – PPO | Admitting: Anesthesiology

## 2022-12-18 ENCOUNTER — Ambulatory Visit (HOSPITAL_COMMUNITY)
Admission: RE | Admit: 2022-12-18 | Discharge: 2022-12-18 | Disposition: A | Discharge status: Home / Self Care | Payer: BC Managed Care – PPO | Source: Ambulatory Visit | Attending: Orthopedic Surgery | Admitting: Orthopedic Surgery

## 2022-12-18 ENCOUNTER — Other Ambulatory Visit: Payer: Self-pay

## 2022-12-18 ENCOUNTER — Encounter (HOSPITAL_COMMUNITY)
Admission: RE | Disposition: A | Discharge status: Home / Self Care | Payer: Self-pay | Source: Ambulatory Visit | Attending: Orthopedic Surgery

## 2022-12-18 DIAGNOSIS — W3400XA Accidental discharge from unspecified firearms or gun, initial encounter: Secondary | ICD-10-CM | POA: Insufficient documentation

## 2022-12-18 DIAGNOSIS — S62327A Displaced fracture of shaft of fifth metacarpal bone, left hand, initial encounter for closed fracture: Secondary | ICD-10-CM | POA: Diagnosis not present

## 2022-12-18 DIAGNOSIS — S62327B Displaced fracture of shaft of fifth metacarpal bone, left hand, initial encounter for open fracture: Secondary | ICD-10-CM | POA: Diagnosis present

## 2022-12-18 DIAGNOSIS — F17290 Nicotine dependence, other tobacco product, uncomplicated: Secondary | ICD-10-CM | POA: Insufficient documentation

## 2022-12-18 DIAGNOSIS — S62617B Displaced fracture of proximal phalanx of left little finger, initial encounter for open fracture: Secondary | ICD-10-CM | POA: Diagnosis not present

## 2022-12-18 DIAGNOSIS — I10 Essential (primary) hypertension: Secondary | ICD-10-CM | POA: Diagnosis not present

## 2022-12-18 HISTORY — PX: CLOSED REDUCTION FINGER WITH PERCUTANEOUS PINNING: SHX5612

## 2022-12-18 HISTORY — PX: INCISION AND DRAINAGE OF WOUND: SHX1803

## 2022-12-18 SURGERY — IRRIGATION AND DEBRIDEMENT WOUND
Anesthesia: Monitor Anesthesia Care | Site: Finger | Laterality: Left

## 2022-12-18 MED ORDER — BUPIVACAINE HCL (PF) 0.25 % IJ SOLN
INTRAMUSCULAR | Status: AC
  Filled 2022-12-18: qty 30

## 2022-12-18 MED ORDER — CEFAZOLIN SODIUM-DEXTROSE 2-4 GM/100ML-% IV SOLN
2.0000 g | INTRAVENOUS | Status: AC
  Administered 2022-12-18: 2 g via INTRAVENOUS

## 2022-12-18 MED ORDER — CHLORHEXIDINE GLUCONATE 0.12 % MT SOLN: 15.0000 mL | OROMUCOSAL | Status: AC

## 2022-12-18 MED ORDER — CHLORHEXIDINE GLUCONATE 0.12 % MT SOLN
OROMUCOSAL | Status: AC
  Administered 2022-12-18: 15 mL via OROMUCOSAL
  Filled 2022-12-18: qty 15

## 2022-12-18 MED ORDER — FENTANYL CITRATE (PF) 100 MCG/2ML IJ SOLN
INTRAMUSCULAR | Status: AC
  Administered 2022-12-18: 50 ug via INTRAVENOUS
  Filled 2022-12-18: qty 2

## 2022-12-18 MED ORDER — OXYCODONE HCL 5 MG PO TABS
5.0000 mg | ORAL_TABLET | Freq: Four times a day (QID) | ORAL | 0 refills | Status: AC | PRN
Start: 2022-12-18 — End: 2022-12-25

## 2022-12-18 MED ORDER — 0.9 % SODIUM CHLORIDE (POUR BTL) OPTIME
TOPICAL | Status: DC | PRN
  Administered 2022-12-18: 1000 mL

## 2022-12-18 MED ORDER — CEFAZOLIN SODIUM-DEXTROSE 2-4 GM/100ML-% IV SOLN
INTRAVENOUS | Status: AC
  Filled 2022-12-18: qty 100

## 2022-12-18 MED ORDER — PHENYLEPHRINE 80 MCG/ML (10ML) SYRINGE FOR IV PUSH (FOR BLOOD PRESSURE SUPPORT)
PREFILLED_SYRINGE | INTRAVENOUS | Status: AC
  Filled 2022-12-18: qty 10

## 2022-12-18 MED ORDER — MIDAZOLAM HCL 2 MG/2ML IJ SOLN
INTRAMUSCULAR | Status: AC
  Administered 2022-12-18: 1 mg via INTRAVENOUS
  Filled 2022-12-18: qty 2

## 2022-12-18 MED ORDER — MIDAZOLAM HCL 2 MG/2ML IJ SOLN: 1.0000 mg | Freq: Once | INTRAMUSCULAR | Status: AC

## 2022-12-18 MED ORDER — FENTANYL CITRATE (PF) 100 MCG/2ML IJ SOLN: 50.0000 ug | Freq: Once | INTRAMUSCULAR | Status: AC

## 2022-12-18 MED ORDER — SODIUM CHLORIDE 0.9 % IR SOLN
Status: DC | PRN
  Administered 2022-12-18: 3000 mL

## 2022-12-18 MED ORDER — BUPIVACAINE-EPINEPHRINE (PF) 0.5% -1:200000 IJ SOLN
INTRAMUSCULAR | Status: DC | PRN
Start: 2022-12-18 — End: 2022-12-18
  Administered 2022-12-18: 30 mL via PERINEURAL

## 2022-12-18 MED ORDER — PROPOFOL 10 MG/ML IV BOLUS
INTRAVENOUS | Status: DC | PRN
  Administered 2022-12-18: 40 mg via INTRAVENOUS

## 2022-12-18 MED ORDER — PHENYLEPHRINE 80 MCG/ML (10ML) SYRINGE FOR IV PUSH (FOR BLOOD PRESSURE SUPPORT)
PREFILLED_SYRINGE | INTRAVENOUS | Status: DC | PRN
  Administered 2022-12-18: 40 ug via INTRAVENOUS
  Administered 2022-12-18 (×3): 80 ug via INTRAVENOUS

## 2022-12-18 MED ORDER — PROPOFOL 500 MG/50ML IV EMUL
INTRAVENOUS | Status: DC | PRN
  Administered 2022-12-18: 125 ug/kg/min via INTRAVENOUS

## 2022-12-18 MED ORDER — PROPOFOL 10 MG/ML IV BOLUS
INTRAVENOUS | Status: AC
  Filled 2022-12-18: qty 20

## 2022-12-18 MED ORDER — LACTATED RINGERS IV SOLN: INTRAVENOUS | Status: DC

## 2022-12-18 SURGICAL SUPPLY — 82 items
APL PRP STRL LF DISP 70% ISPRP (MISCELLANEOUS)
BAG COUNTER SPONGE SURGICOUNT (BAG) ×2 IMPLANT
BAG SPNG CNTER NS LX DISP (BAG)
BLADE CLIPPER SURG (BLADE) IMPLANT
BNDG CMPR 5X3 KNIT ELC UNQ LF (GAUZE/BANDAGES/DRESSINGS) ×2
BNDG CMPR 5X4 KNIT ELC UNQ LF (GAUZE/BANDAGES/DRESSINGS) ×2
BNDG CMPR 75X21 PLY HI ABS (MISCELLANEOUS)
BNDG CMPR 9X4 STRL LF SNTH (GAUZE/BANDAGES/DRESSINGS)
BNDG ELASTIC 2X5.8 VLCR STR LF (GAUZE/BANDAGES/DRESSINGS) ×2 IMPLANT
BNDG ELASTIC 3INX 5YD STR LF (GAUZE/BANDAGES/DRESSINGS) ×2 IMPLANT
BNDG ELASTIC 4INX 5YD STR LF (GAUZE/BANDAGES/DRESSINGS) IMPLANT
BNDG ELASTIC 4X5.8 VLCR STR LF (GAUZE/BANDAGES/DRESSINGS) ×2 IMPLANT
BNDG ESMARK 4X9 LF (GAUZE/BANDAGES/DRESSINGS) ×2 IMPLANT
BNDG GAUZE DERMACEA FLUFF 4 (GAUZE/BANDAGES/DRESSINGS) ×4 IMPLANT
BNDG GZE DERMACEA 4 6PLY (GAUZE/BANDAGES/DRESSINGS) ×2
CHLORAPREP W/TINT 26 (MISCELLANEOUS) ×2 IMPLANT
CORD BIPOLAR FORCEPS 12FT (ELECTRODE) ×2 IMPLANT
COVER BACK TABLE 60X90IN (DRAPES) IMPLANT
COVER MAYO STAND STRL (DRAPES) ×2 IMPLANT
COVER SURGICAL LIGHT HANDLE (MISCELLANEOUS) ×2 IMPLANT
CUFF TOURN SGL QUICK 18X4 (TOURNIQUET CUFF) ×2 IMPLANT
CUFF TOURN SGL QUICK 24 (TOURNIQUET CUFF)
CUFF TRNQT CYL 24X4X16.5-23 (TOURNIQUET CUFF) IMPLANT
DRAIN PENROSE 18X1/4 LTX STRL (DRAIN) IMPLANT
DRAPE HALF SHEET 40X57 (DRAPES) ×2 IMPLANT
DRAPE OEC MINIVIEW 54X84 (DRAPES) ×2 IMPLANT
DRAPE SURG 17X23 STRL (DRAPES) ×2 IMPLANT
DRSG ADAPTIC 3X8 NADH LF (GAUZE/BANDAGES/DRESSINGS) ×2 IMPLANT
ELECT REM PT RETURN 9FT ADLT (ELECTROSURGICAL)
ELECTRODE REM PT RTRN 9FT ADLT (ELECTROSURGICAL) IMPLANT
GAUZE 4X4 16PLY ~~LOC~~+RFID DBL (SPONGE) ×2 IMPLANT
GAUZE SPONGE 4X4 12PLY STRL (GAUZE/BANDAGES/DRESSINGS) ×2 IMPLANT
GAUZE STRETCH 2X75IN STRL (MISCELLANEOUS) IMPLANT
GAUZE XEROFORM 1X8 LF (GAUZE/BANDAGES/DRESSINGS) ×2 IMPLANT
GAUZE XEROFORM 5X9 LF (GAUZE/BANDAGES/DRESSINGS) IMPLANT
GLOVE BIO SURGEON STRL SZ7 (GLOVE) ×2 IMPLANT
GLOVE BIOGEL PI IND STRL 7.0 (GLOVE) ×2 IMPLANT
GLOVE SURG ORTHO 7.0 STRL STRW (GLOVE) IMPLANT
GLOVE SURG UNDER POLY LF SZ7 (GLOVE) ×2 IMPLANT
GOWN STRL REUS W/ TWL LRG LVL3 (GOWN DISPOSABLE) ×4 IMPLANT
GOWN STRL REUS W/ TWL XL LVL3 (GOWN DISPOSABLE) ×4 IMPLANT
GOWN STRL REUS W/TWL LRG LVL3 (GOWN DISPOSABLE) ×4
GOWN STRL REUS W/TWL XL LVL3 (GOWN DISPOSABLE) ×4
IV CATH 18G X1.75 CATHLON (IV SOLUTION) IMPLANT
KIT BASIN OR (CUSTOM PROCEDURE TRAY) ×2 IMPLANT
KIT TURNOVER KIT B (KITS) ×2 IMPLANT
LOOP VASCLR MAXI BLUE 18IN ST (MISCELLANEOUS) IMPLANT
LOOP VASCULAR MAXI 18 BLUE (MISCELLANEOUS)
LOOPS VASCLR MAXI BLUE 18IN ST (MISCELLANEOUS) IMPLANT
MANIFOLD NEPTUNE II (INSTRUMENTS) IMPLANT
NDL HYPO 25GX1X1/2 BEV (NEEDLE) IMPLANT
NDL HYPO 25X1 1.5 SAFETY (NEEDLE) ×2 IMPLANT
NDL KEITH (NEEDLE) IMPLANT
NEEDLE HYPO 25GX1X1/2 BEV (NEEDLE) IMPLANT
NEEDLE HYPO 25X1 1.5 SAFETY (NEEDLE) IMPLANT
NEEDLE KEITH (NEEDLE) IMPLANT
NS IRRIG 1000ML POUR BTL (IV SOLUTION) ×2 IMPLANT
PACK ORTHO EXTREMITY (CUSTOM PROCEDURE TRAY) ×2 IMPLANT
PAD ABD 8X10 STRL (GAUZE/BANDAGES/DRESSINGS) ×2 IMPLANT
PAD ARMBOARD 7.5X6 YLW CONV (MISCELLANEOUS) ×4 IMPLANT
PAD CAST 4YDX4 CTTN HI CHSV (CAST SUPPLIES) ×4 IMPLANT
PADDING CAST ABS COTTON 3X4 (CAST SUPPLIES) ×2 IMPLANT
PADDING CAST COTTON 4X4 STRL (CAST SUPPLIES)
SLING ARM IMMOBILIZER LRG (SOFTGOODS) IMPLANT
SPLINT PLASTER CAST XFAST 3X15 (CAST SUPPLIES) ×2 IMPLANT
SPONGE T-LAP 4X18 ~~LOC~~+RFID (SPONGE) ×2 IMPLANT
SUCTION TUBE FRAZIER 10FR DISP (SUCTIONS) ×2 IMPLANT
SUT ETHILON 4 0 PS 2 18 (SUTURE) IMPLANT
SUT FIBERWIRE 3-0 18 TAPR NDL (SUTURE)
SUT SILK 4 0 P 3 (SUTURE) IMPLANT
SUT VICRYL RAPIDE 4/0 PS 2 (SUTURE) IMPLANT
SUTURE FIBERWR 3-0 18 TAPR NDL (SUTURE) IMPLANT
SWAB CULTURE ESWAB REG 1ML (MISCELLANEOUS) IMPLANT
SYR BULB EAR ULCER 3OZ GRN STR (SYRINGE) ×2 IMPLANT
SYR CONTROL 10ML LL (SYRINGE) IMPLANT
TOWEL GREEN STERILE (TOWEL DISPOSABLE) ×2 IMPLANT
TOWEL GREEN STERILE FF (TOWEL DISPOSABLE) ×4 IMPLANT
TUBE CONNECTING 12X1/4 (SUCTIONS) IMPLANT
UNDERPAD 30X36 HEAVY ABSORB (UNDERPADS AND DIAPERS) ×2 IMPLANT
VASCULAR TIE MAXI BLUE 18IN ST (MISCELLANEOUS)
WATER STERILE IRR 1000ML POUR (IV SOLUTION) ×2 IMPLANT
YANKAUER SUCT BULB TIP NO VENT (SUCTIONS) IMPLANT

## 2022-12-18 NOTE — Anesthesia Postprocedure Evaluation (Signed)
Anesthesia Post Note  Patient: Keith Riley  Procedure(s) Performed: IRRIGATION AND DEBRIDEMENTLEFT HAND (Left) FIFTH RAY RESECTION (Left: Finger)     Patient location during evaluation: PACU Anesthesia Type: Regional and MAC Level of consciousness: awake and alert Pain management: pain level controlled Vital Signs Assessment: post-procedure vital signs reviewed and stable Respiratory status: spontaneous breathing, nonlabored ventilation, respiratory function stable and patient connected to nasal cannula oxygen Cardiovascular status: stable and blood pressure returned to baseline Postop Assessment: no apparent nausea or vomiting Anesthetic complications: no   No notable events documented.  Last Vitals:  Vitals:   12/18/22 2100 12/18/22 2115  BP: (!) 171/105 (!) 187/96  Pulse: 68 (!) 55  Resp: 14 14  Temp:  (!) 36.2 C  SpO2: 100% 93%    Last Pain:  Vitals:   12/18/22 2115  PainSc: 0-No pain                 Earl Lites P Fannie Gathright
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
## 2022-12-18 NOTE — Progress Notes (Signed)
MDA made aware patients BP 187/96 and HR 55.  Family member at bedside and voiced patient is supposed to take BP medicine but does not take it.  Patient arrived preop and blood pressures were consistent with present blood pressures.  Pain is under control at present time and patient will be discharged home.

## 2022-12-18 NOTE — Transfer of Care (Signed)
Immediate Anesthesia Transfer of Care Note  Patient: Keith Riley  Procedure(s) Performed: IRRIGATION AND DEBRIDEMENTLEFT HAND (Left) FIFTH RAY RESECTION (Left: Finger)  Patient Location: PACU  Anesthesia Type:MAC and Regional  Level of Consciousness: awake and alert   Airway & Oxygen Therapy: Patient Spontanous Breathing  Post-op Assessment: Report given to RN and Post -op Vital signs reviewed and stable  Post vital signs: Reviewed and stable  Last Vitals:  Vitals Value Taken Time  BP 160/99 12/18/22 2053  Temp    Pulse 83 12/18/22 2056  Resp 23 12/18/22 2056  SpO2 98 % 12/18/22 2056  Vitals shown include unfiled device data.  Last Pain:  Vitals:   12/18/22 1457  PainSc: 0-No pain      Patients Stated Pain Goal: 0 (12/18/22 1457)  Complications: No notable events documented.
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
## 2022-12-18 NOTE — Anesthesia Procedure Notes (Signed)
Anesthesia Regional Block: Axillary brachial plexus block   Pre-Anesthetic Checklist: , timeout performed,  Correct Patient, Correct Site, Correct Laterality,  Correct Procedure, Correct Position, site marked,  Risks and benefits discussed,  Surgical consent,  Pre-op evaluation,  At surgeon's request and post-op pain management  Laterality: Left  Prep: chloraprep       Needles:  Injection technique: Single-shot  Needle Type: Echogenic Needle     Needle Length: 5cm  Needle Gauge: 21     Additional Needles:   Narrative:  Start time: 12/18/2022 5:52 PM End time: 12/18/2022 5:56 PM Injection made incrementally with aspirations every 5 mL.  Performed by: Personally  Anesthesiologist: Beryle Lathe, MD  Additional Notes: No pain on injection. No increased resistance to injection. Injection made in 5cc increments. Good needle visualization. Patient tolerated the procedure well.

## 2022-12-18 NOTE — Anesthesia Procedure Notes (Signed)
Procedure Name: MAC Date/Time: 12/18/2022 7:32 PM  Performed by: Tressia Miners, CRNAPre-anesthesia Checklist: Patient identified, Emergency Drugs available, Suction available, Patient being monitored and Timeout performed Patient Re-evaluated:Patient Re-evaluated prior to induction Oxygen Delivery Method: Simple face mask

## 2022-12-18 NOTE — H&P (Signed)
Patient and I had a very thorough discussion regarding the nature of his injury and treatment options.  We discussed that one option for this injury would be attempted salvage of the small finger.  We discussed that this would most likely involve use of a bridge plate to span the large bone defect with use of an antibiotic cement spacer.  A second surgery would take place approximately 6 weeks from today at which time we would remove the antibiotic spacer and fill the void with bone graft for arthrodesis of the MCP joint.  We discussed that the bony defect would require a period of at least 3 to 4 months for healing given the size of the defect and amount of bone loss.  We discussed that the alternative would be a ray resection given the severe soft tissue and bony injury.  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  Patient is not interested in multiple procedures and would like to get back to his activities and job as quickly as possible.  He is not interested in a prolonged reconstructive course.  For that reason, he would prefer a ray resection rather than attempted reconstruction.  Questions were encouraged and answered.  Marlyne Beards, M.D. [facility]

## 2022-12-18 NOTE — Anesthesia Preprocedure Evaluation (Addendum)
Anesthesia Evaluation  Patient identified by MRN, date of birth, ID band Patient awake    Reviewed: Allergy & Precautions, NPO status , Patient's Chart, lab work & pertinent test results, reviewed documented beta blocker date and time   History of Anesthesia Complications Negative for: history of anesthetic complications  Airway Mallampati: III  TM Distance: >3 FB     Dental no notable dental hx.    Pulmonary neg sleep apnea, neg COPD, Current Smoker and Patient abstained from smoking.   breath sounds clear to auscultation       Cardiovascular hypertension, (-) angina (-) CAD, (-) Past MI, (-) Cardiac Stents, (-) CABG and (-) Orthopnea (-) pacemaker(-) Cardiac Defibrillator (-) Valvular Problems/Murmurs Rhythm:Regular Rate:Normal     Neuro/Psych neg Seizures    GI/Hepatic ,neg GERD  ,,(+) neg Cirrhosis        Endo/Other  neg diabetes    Renal/GU Renal disease     Musculoskeletal   Abdominal   Peds  Hematology   Anesthesia Other Findings   Reproductive/Obstetrics                             Anesthesia Physical Anesthesia Plan  ASA: 2  Anesthesia Plan: MAC   Post-op Pain Management:    Induction: Intravenous  PONV Risk Score and Plan: 1 and Ondansetron  Airway Management Planned:   Additional Equipment:   Intra-op Plan:   Post-operative Plan: Extubation in OR  Informed Consent: I have reviewed the patients History and Physical, chart, labs and discussed the procedure including the risks, benefits and alternatives for the proposed anesthesia with the patient or authorized representative who has indicated his/her understanding and acceptance.     Dental advisory given  Plan Discussed with: CRNA  Anesthesia Plan Comments:        Anesthesia Quick Evaluation

## 2022-12-18 NOTE — Discharge Instructions (Signed)
Waylan Rocher, M.D. Hand Surgery  POST-OPERATIVE DISCHARGE INSTRUCTIONS   PRESCRIPTIONS: You may have been given a prescription to be taken as directed for post-operative pain control.  You may also take over the counter ibuprofen/aleve and tylenol for pain. Take this as directed on the packaging. Do not exceed 3000 mg tylenol/acetaminophen in 24 hours.  Ibuprofen 600-800 mg (3-4) tablets by mouth every 6 hours as needed for pain.  OR Aleve 2 tablets by mouth every 12 hours (twice daily) as needed for pain.  AND/OR Tylenol 1000 mg (2 tablets) every 8 hours as needed for pain.  Please use your pain medication carefully, as refills are limited and you may not be provided with one.  As stated above, please use over the counter pain medicine - it will also be helpful with decreasing your swelling.    ANESTHESIA: After your surgery, post-surgical discomfort or pain is likely. This discomfort can last several days to a few weeks. At certain times of the day your discomfort may be more intense.   Did you receive a nerve block?  A nerve block can provide pain relief for one hour to two days after your surgery. As long as the nerve block is working, you will experience little or no sensation in the area the surgeon operated on.  As the nerve block wears off, you will begin to experience pain or discomfort. It is very important that you begin taking your prescribed pain medication before the nerve block fully wears off. Treating your pain at the first sign of the block wearing off will ensure your pain is better controlled and more tolerable when full-sensation returns. Do not wait until the pain is intolerable, as the medicine will be less effective. It is better to treat pain in advance than to try and catch up.   General Anesthesia:  If you did not receive a nerve block during your surgery, you will need to start taking your pain medication shortly after your surgery and should continue  to do so as prescribed by your surgeon.     ICE AND ELEVATION: You may use ice for the first 48-72 hours, but it is not critical.   Motion of your fingers is very important to decrease the swelling.  Elevation, as much as possible for the next 48 hours, is critical for decreasing swelling as well as for pain relief. Elevation means when you are seated or lying down, you hand should be at or above your heart. When walking, the hand needs to be at or above the level of your elbow.  If the bandage gets too tight, it may need to be loosened. Please contact our office and we will instruct you in how to do this.    SURGICAL BANDAGES:  Keep your dressing and/or splint clean and dry at all times.  Do not remove until you are seen again in the office.  If careful, you may place a plastic bag over your bandage and tape the end to shower, but be careful, do not get your bandages wet.     HAND THERAPY:  You will be contacted to set up the start date for therapy.    ACTIVITY AND WORK: You are encouraged to move any fingers which are not in the bandage.  Light use of the fingers is allowed to assist the other hand with daily hygiene and eating, but strong gripping or lifting is often uncomfortable and should be avoided.  You might miss a  variable period of time from work and hopefully this issue has been discussed prior to surgery. You may not do any heavy work with your affected hand for about 2 weeks.    [facility] Second Floor, [address] [place], [place] [address] [phone]
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
## 2022-12-19 ENCOUNTER — Encounter (HOSPITAL_COMMUNITY): Payer: Self-pay | Admitting: Orthopedic Surgery

## 2023-09-22 DIAGNOSIS — N5201 Erectile dysfunction due to arterial insufficiency: Secondary | ICD-10-CM | POA: Diagnosis not present

## 2023-09-22 DIAGNOSIS — C61 Malignant neoplasm of prostate: Secondary | ICD-10-CM | POA: Diagnosis not present

## 2023-09-22 DIAGNOSIS — R3912 Poor urinary stream: Secondary | ICD-10-CM | POA: Diagnosis not present

## 2023-11-19 ENCOUNTER — Other Ambulatory Visit (HOSPITAL_COMMUNITY): Payer: Self-pay | Admitting: Radiology

## 2023-11-19 DIAGNOSIS — R911 Solitary pulmonary nodule: Secondary | ICD-10-CM

## 2023-11-24 ENCOUNTER — Ambulatory Visit (HOSPITAL_COMMUNITY)
Admission: RE | Admit: 2023-11-24 | Discharge: 2023-11-24 | Disposition: A | Discharge status: Home / Self Care | Source: Ambulatory Visit | Attending: Surgery | Admitting: Surgery

## 2023-11-24 DIAGNOSIS — R911 Solitary pulmonary nodule: Secondary | ICD-10-CM | POA: Diagnosis present

## 2023-11-24 LAB — PULMONARY FUNCTION TEST
DL/VA % pred: 94 %
DL/VA: 3.8 ml/min/mmHg/L
DLCO unc % pred: 84 %
DLCO unc: 21.02 ml/min/mmHg
FEF 25-75 Pre: 3.67 L/s
FEF2575-%Pred-Pre: 160 %
FEV1-%Pred-Pre: 108 %
FEV1-Pre: 3.32 L
FEV1FVC-%Pred-Pre: 113 %
FEV6-%Pred-Pre: 102 %
FEV6-Pre: 4.02 L
FEV6FVC-%Pred-Pre: 106 %
FVC-%Pred-Pre: 95 %
FVC-Pre: 4.02 L
Pre FEV1/FVC ratio: 83 %
Pre FEV6/FVC Ratio: 100 %

## 2024-01-14 NOTE — H&P (Signed)
 Brief Pre-operative History & Physical  Patient name: Keith Riley CSN: [id number] MRN: [id number] Admit Date: 01/14/2024 Date of Surgery: 01/14/2024 Performing Service: Thoracic   Code Status: Full Code   Assessment/Plan:   Keith Riley is a 74 y.o. male with Malignant neoplasm of upper lobe of right lung    (CMS-HCC) [C34.11], who presents for: Procedure(s) (LRB): ROBOTIC XI THORACOSCOPY, SURGICAL; WITH DIAGNOSTIC WEDGE RESECTION FOLLOWED BY ANATOMIC LUNG RESECTION (Right).   He presents for: Procedure(s) (LRB): ROBOTIC XI THORACOSCOPY, SURGICAL; WITH DIAGNOSTIC WEDGE RESECTION FOLLOWED BY ANATOMIC LUNG RESECTION (Right).   Consent obtained in office is accurate. Risks, benefits, and alternatives to surgery were reviewed, and all questions were answered.  Proceed to the OR as planned.     History of Present Illness:  Keith Riley is a 73 y.o. male with Malignant neoplasm of upper lobe of right lung    (CMS-HCC) [C34.11]. He was recently seen in clinic, where a detailed HPI can be found. He was noted to benefit from: Procedure(s) (LRB): ROBOTIC XI THORACOSCOPY, SURGICAL; WITH DIAGNOSTIC WEDGE RESECTION FOLLOWED BY ANATOMIC LUNG RESECTION (Right).    Allergies Patient has no known allergies.  Home Medications   Prior to Admission medications  Medication Sig Start Date End Date Taking? Authorizing Provider  amLODIPine (NORVASC) 10 MG tablet Take 1 tablet (10 mg total) by mouth in the morning.   Yes [provider]  ibuprofen  (MOTRIN ) 400 MG tablet Take 1 tablet (400 mg total) by mouth every six (6) hours. 03/05/22  Yes Arloa Renate Anthony, FNP    Vital Signs BP 155/89   Pulse 55   Temp 36.7 C (98.1 F)   Resp 18   Wt 91.8 kg (202 lb 6.1 oz)   SpO2 100%   BMI 30.32 kg/m  Facility age limit for growth %iles is 20 years. Facility age limit for growth %iles is 20 years..   Physical Exam General: Well developed, appears stated age, in no  acute distress Mental status: Alert and oriented x3 Cardiovascular: Normal Pulmonary: Symmetric chest rise, unlabored breathing Relevant System for Surgery: Surgical site examined  Labs and Studies: Lab Results  Component Value Date   WBC 3.7 12/30/2023   HGB 13.2 12/30/2023   HCT 37.7 (L) 12/30/2023   PLT 206 12/30/2023    Lab Results  Component Value Date   PT 11.1 05/15/2023   INR 0.97 05/15/2023   APTT 28.4 02/28/2022    Relevant Past Medical History: Past Medical History[1]  Relevant Past Surgical History: Past Surgical History[2]        [1] Past Medical History: Diagnosis Date  . Hypertension   [2] Past Surgical History: Procedure Laterality Date  . CHG CT GUIDANCE NEEDLE PLACEMENT N/A 12/12/2023   Procedure: COMPUTED TOMOGRAPHY GUIDANCE FOR NEEDLE PLACEMENT (EG, BIOPSY, ASPIRATION, INJECTION, LOCALIZATION DEVICE), RADIOLOGICAL SUPERVISION AND INTERPRETATION;  Surgeon: Claudene Elsie Gee, MD;  [facility];  Service: Pulmonary  . PR BRNCHSC EBUS GUIDED SAMPL 1/2 NODE STATION/STRUX N/A 12/12/2023   Procedure: BRONCH, RIGID OR FLEXIBLE, INC FLUORO GUIDANCE, WHEN PERFORMED; WITH EBUS GUIDED TRANSTRACHEAL AND/OR TRANSBRONCHIAL SAMPLING, ONE OR TWO MEDIASTINAL AND/OR HILAR LYMPH NODE STATIONS OR STRUCTURES;  Surgeon: Claudene Elsie Gee, MD;  [facility];  Service: Pulmonary  . PR BRONCHOSCOPY,COMPUTER ASSIST/IMAGE-GUIDED NAVIGATION N/A 12/12/2023   Procedure: ROBOT ION BRONCHOSCOPY,RIGID OR FLEXIBLE,INCLUDE FLUORO WHEN PERFORMED; W/COMPUTER-ASSIST,IMAGE-GUIDED NAVIGATION;  Surgeon: Claudene Elsie Gee, MD;  Location[facility];  Service: Pulmonary  . PR BRONCHOSCOPY,TRANSBRON ASPIR  BX N/A 12/12/2023   Procedure: BRONCHOSCOPY, RIGID/FLEX, INCL FLUORO; W/TRANSBRONCH NDL ASPIRAT BX, TRACHEA, MAIN STEM &/OR LOBAR BRONCHUS;  Surgeon: Claudene Elsie Gee, MD;  [facility];  Service: Pulmonary  . PR BRONCHOSCOPY,TRANSBRONCH  BIOPSY N/A 12/12/2023   Procedure: BRONCHOSCOPY, RIGID/FLEXIBLE, INCLUDE FLUORO GUIDANCE WHEN PERFORMED; W/TRANSBRONCHIAL LUNG BX, SINGLE LOBE;  Surgeon: Claudene Elsie Gee, MD;  [facility];  Service: Pulmonary  . PR CYSTOSCOPY,INSERT URETERAL STENT Right 02/28/2022   Procedure: CYSTOURETHROSCOPY,  WITH INSERTION OF INDWELLING URETERAL STENT (EG, GIBBONS OR DOUBLE-J TYPE);  Surgeon: Waymond James, MD;  Location: MAIN OR [facility];  Service: Urology  . PR EXCISION/DESTRUCTION OPEN ABDOMINAL TUMORS >10.0 CM Right 02/28/2022   Procedure: EXCISION/DESTRUCTION, OPEN, INTRA-ABD TUMOR, CYST, ENDOMETROMA, 1+ PERIT, MESENT, RETROPERIT, LARGEST >10CM;  Surgeon: Luke Phebe Bellingham, MD;  Location: MAIN OR [facility];  Service: Surgical Oncology  . PR OMENTAL FLAP,INTRA-ABDOMINAL N/A 02/28/2022   Procedure: OMENTAL FLAP;  Surgeon: Luke Phebe Bellingham, MD;  [facility] OR [facility];  Service: Surgical Oncology  . PR RAD RESECTION TUMOR SOFT TISSUE THIGH/KNEE 5+CM Right 02/28/2022   Procedure: RADICAL RESECTION OF TUMOR, SOFT TISSUE OF THIGH OR KNEE AREA; 5 CM OR GREATER;  Surgeon: Mardeen Lamar Rush, MD;  Location: MAIN OR [facility];  Service: Orthopedics  . PR RELEASE URETER,RETROPER FIBROSIS Right 02/28/2022   Procedure: URETEROLYSIS, WITH OR WITHOUT REPOSITIONING OF URETER FOR RETROPERITONEAL FIBROSIS;  Surgeon: Luke Phebe Bellingham, MD;  Location: MAIN OR [facility];  Service: Surgical Oncology  . PR REPAIR FEMORAL HERNIA,REDUCIBLE Right 02/28/2022   Procedure: REPAIR INITIAL FEMORAL HERNIA, ANY AGE; REDUCIBLE;  Surgeon: Vernida Pauline Pollock, MD;  Location: MAIN OR [facility];  Service: Plastics  . PR REPAIR LUMBAR HERNIA Right 02/28/2022   Procedure: REPAIR LUMBAR HERNIA;  Surgeon: Vernida Pauline Pollock, MD;  Location: MAIN OR [facility];  Service: Plastics

## 2024-01-15 NOTE — Consults (Signed)
 OCCUPATIONAL THERAPY Evaluation (01/15/24 0823)  Patient Name:  Keith Riley      Medical Record Number: [id number]   Date of Birth: 1950/04/17 Sex: Male    Post-Discharge Occupational Therapy Recommendations: Skilled OT services NOT indicated       Equipment Recommendation OT DME Recommendations: None     OT Treatment Diagnosis:        Assessment Assessment: Trevone is a 73 y.o. male with Malignant neoplasm of upper lobe of right lung    (CMS-HCC) (C34.11), who presents for: ROBOTIC XI THORACOSCOPY, SURGICAL; WITH DIAGNOSTIC WEDGE RESECTION FOLLOWED BY ANATOMIC LUNG RESECTION (Right).- per EPIC       Patient seen for initial OT evaluation and occupational profile. With consideration of patient's occupational profile, assessment review, level of clinical decision making involved, and intervention plan, patient presents as a moderate complexity case w/ the following functional deficits: diminished aerobic capacity and activity tolerance that are within expected range following wedge resection. Patient w/ no identified OT needs at this time.  Problem List: Decreased activity tolerance     Today's Interventions: Patient educated regarding: role of OT, PNA prevention/pulmonary hygiene (upright posturing, use of IS, good oral care), and the role of daily participation in ADLs for maintaining/improving activity tolerance while in the hospital. Patient completed bed mobility, functional transfers, and functional mobility w/ close SBA and verbal cues for pacing, pursed lip breathing, and standing rest x 2 2/2 hypertension (SBP in the 200s). Patient left s/u for breakfast, agreeable to donning underwear s/p foley removal and completing oral care for PNA prevention.  Activity Tolerance During Today's Session Tolerated treatment well  Plan Planned Frequency of Treatment:   D/C Services Weekly Frequency: D/C Services    GOALS:  Patient and Family Goals: To go home  Prognosis:   Good Positive Indicators:  PLOF, current LOF Barriers to Discharge: None  Subjective Prior Functional Status Patient reports that he lives w/ his wife and son- has assist if needed following discharge. Patient reports independence w/ ADLs and IADLs at baseline. Works as a Midwife. Denies need for AD, denies recent falls.  Living Situation Living Environment: Apartment Lives With: Spouse, Son Home Living: One level home, Stairs to enter with rails, Tub/shower unit, Standard height toilet Rail placement (outside): Rail on right side Number of Stairs to Enter (outside): 13 Caregiver Identified?: Yes Caregiver Availability: 24 hours Caregiver Identified?: Yes Equipment available at home: None  Medical Tests / Procedures: Reviewed in EPIC     Patient / Caregiver reports: He walks fast- patient's wife   Past Medical History[1] Social History   Tobacco Use  . Smoking status: Former    Current packs/day: 0.00    Average packs/day: 0.3 packs/day for 20.0 years (5.0 ttl pk-yrs)    Types: Cigarettes    Start date: 31    Quit date: 1998    Years since quitting: 27.7  . Smokeless tobacco: Never  Substance Use Topics  . Alcohol use: Not Currently    Past Surgical History[2] Family History[3]   Patient has no known allergies.   Objective Findings Precautions / Restrictions  Falls precautions     Weight Bearing  Non-applicable  Required Braces or Orthoses  Non-applicable  Communication Preference  Verbal     Pain  Patient reports 5/10 pain w/ deep breathing, PCA use x 1 during session, emotional support provided  Equipment / Environment  Vascular access (PIV, TLC, Port-a-cath, PICC), Telemetry, Arterial line, Chest Tube, Foley, PCA    Cognition  Orientation Level:  Oriented x 4  Arousal/Alertness:  Appropriate responses to stimuli  Attention Span:  Appears intact  Memory:  Appears intact  Following Commands:  Follows all commands without difficulty  Safety  Judgment:  Good awareness of safety precautions  Awareness of Errors and Problem Solving:     Comments: Min verbal cues provided for pacing and safety w/ lines  Vision / Hearing  Vision: No acute deficits identified Vision Comments: Does not wear glasses, denies blurry/double vision Hearing: No deficit identified      Hand Function: Right Hand Function: Right hand grip strength, ROM and coordination WNL Left Hand Function: Left hand grip strength, ROM and coordination WNL Hand Dominance: Unknown  Skin Inspection: Skin Inspection: Intact where visualized, Good skin integrity, Incision C/D/I  ROM / Strength: UE ROM/Strength: Left WFL, Right WFL LE ROM/Strength: Left WFL, Right WFL  Coordination: Coordination: WFL  Sensation: RUE Sensation: RUE intact LUE Sensation: LUE intact RLE Sensation: RLE intact LLE Sensation: LLE intact Sensory/ Proprioception/ Stereognosis comments: Denies numbness/tingling  Balance: Animal nutritionist of Assistance: Independent Dynamic Sitting-Level of Assistance: Water engineer Standing-Level of Assistance: Stand by assistance Dynamic Standing - Level of Assistance: Stand by assistance  Functional Mobility Transfers: Standby assist Bed Mobility - Needs Assistance: Standby assist (HOB elevated) Ambulation: SBA for functional mobility w/ and w/o use of rollator, cues for pacing and pursed lip breathing  ADLs Feeding : Stand by assist Grooming: Independent, Supervision Bathing: Supervision Toileting: Supervision UB Dressing: Stand by Assist LB Dressing: Min assist IADLs: Not tested  Vitals / Orthostatics Vitals/Orthostatics: HR 50, BP 156/70 mmHg, SPO2 99% on RA; HR 49-55 during activity, SBP 180-200 during activity  Patient at end of session: All needs in reach, In chair, Lines intact, Notified Nurse   Occupational Therapy Session Duration OT Individual [mins]: 20 OT Co-Treatment [mins]: 8 (overlap w/ Augustin RAMAN, PT) Reason for  Co-treatment: Discharge pending     AM-PAC-Daily Activity Lower Body Dressing assistance needs: A Little - Minimal/Contact Guard Assist/Supervision Bathing assistance needs: A Little - Minimal/Contact Guard Assist/Supervision Toileting assistance needs: A Little - Minimal/Contact Guard Assist/Supervision Upper Body Dressing assistance needs: None - Modified Independent/Independent Personal Grooming assistance needs: A Little - Minimal/Contact Guard Assist/Supervision Eating Meals assistance needs: None - Modified Independent/Independent  Daily Activity Score: 20  Score (in points): % of Functional Impairment, Limitation, Restriction 6: 100% impaired, limited, restricted 7-8: At least 80%, but less than 100% impaired, limited restricted 9-13: At least 60%, but less than 80% impaired, limited restricted 14-19: At least 40%, but less than 60% impaired, limited restricted 20-22: At least 20%, but less than 40% impaired, limited restricted 23: At least 1%, but less than 20% impaired, limited restricted 24: 0% impaired, limited restricted   I attest that I have reviewed the above information. Signed: Harlene JONELLE Adolphus, OT Filed 01/15/2024           [1] Past Medical History: Diagnosis Date  . Hypertension   [2] Past Surgical History: Procedure Laterality Date  . CHG CT GUIDANCE NEEDLE PLACEMENT N/A 12/12/2023   Procedure: COMPUTED TOMOGRAPHY GUIDANCE FOR NEEDLE PLACEMENT (EG, BIOPSY, ASPIRATION, INJECTION, LOCALIZATION DEVICE), RADIOLOGICAL SUPERVISION AND INTERPRETATION;  Surgeon: Claudene Elsie Gee, MD;  [facility];  Service: Pulmonary  . PR BRNCHSC EBUS GUIDED SAMPL 1/2 NODE STATION/STRUX N/A 12/12/2023   Procedure: BRONCH, RIGID OR FLEXIBLE, INC FLUORO GUIDANCE, WHEN PERFORMED; WITH EBUS GUIDED TRANSTRACHEAL AND/OR TRANSBRONCHIAL SAMPLING, ONE OR TWO MEDIASTINAL AND/OR HILAR LYMPH NODE STATIONS OR  STRUCTURES;  Surgeon: Claudene Elsie Gee, MD;  Location[facility];  Service: Pulmonary  . PR BRONCHOSCOPY,COMPUTER ASSIST/IMAGE-GUIDED NAVIGATION N/A 12/12/2023   Procedure: ROBOT ION BRONCHOSCOPY,RIGID OR FLEXIBLE,INCLUDE FLUORO WHEN PERFORMED; W/COMPUTER-ASSIST,IMAGE-GUIDED NAVIGATION;  Surgeon: Claudene Elsie Gee, MD;  [facility];  Service: Pulmonary  . PR BRONCHOSCOPY,TRANSBRON ASPIR BX N/A 12/12/2023   Procedure: BRONCHOSCOPY, RIGID/FLEX, INCL FLUORO; W/TRANSBRONCH NDL ASPIRAT BX, TRACHEA, MAIN STEM &/OR LOBAR BRONCHUS;  Surgeon: Claudene Elsie Gee, MD;  [facility];  Service: Pulmonary  . PR BRONCHOSCOPY,TRANSBRONCH BIOPSY N/A 12/12/2023   Procedure: BRONCHOSCOPY, RIGID/FLEXIBLE, INCLUDE FLUORO GUIDANCE WHEN PERFORMED; W/TRANSBRONCHIAL LUNG BX, SINGLE LOBE;  Surgeon: Claudene Elsie Gee, MD;  [facility];  Service: Pulmonary  . PR CYSTOSCOPY,INSERT URETERAL STENT Right 02/28/2022   Procedure: CYSTOURETHROSCOPY,  WITH INSERTION OF INDWELLING URETERAL STENT (EG, GIBBONS OR DOUBLE-J TYPE);  Surgeon: Waymond James, MD;  Location: MAIN OR [facility];  Service: Urology  . PR EXCISION/DESTRUCTION OPEN ABDOMINAL TUMORS >10.0 CM Right 02/28/2022   Procedure: EXCISION/DESTRUCTION, OPEN, INTRA-ABD TUMOR, CYST, ENDOMETROMA, 1+ PERIT, MESENT, RETROPERIT, LARGEST >10CM;  Surgeon: Luke Phebe Bellingham, MD;  Location: MAIN OR [facility];  Service: Surgical Oncology  . PR OMENTAL FLAP,INTRA-ABDOMINAL N/A 02/28/2022   Procedure: OMENTAL FLAP;  Surgeon: Luke Phebe Bellingham, MD;  Location: MAIN OR [facility];  Service: Surgical Oncology  . PR RAD RESECTION TUMOR SOFT TISSUE THIGH/KNEE 5+CM Right 02/28/2022   Procedure: RADICAL RESECTION OF TUMOR, SOFT TISSUE OF THIGH OR KNEE AREA; 5 CM OR GREATER;  Surgeon: Mardeen Lamar Rush, MD;  Location: MAIN OR [facility];  Service: Orthopedics  . PR RELEASE URETER,RETROPER FIBROSIS Right 02/28/2022   Procedure: URETEROLYSIS, WITH OR WITHOUT REPOSITIONING OF URETER FOR RETROPERITONEAL FIBROSIS;  Surgeon: Luke Phebe Bellingham,  MD;  Location: MAIN OR [facility];  Service: Surgical Oncology  . PR REPAIR FEMORAL HERNIA,REDUCIBLE Right 02/28/2022   Procedure: REPAIR INITIAL FEMORAL HERNIA, ANY AGE; REDUCIBLE;  Surgeon: Vernida Pauline Pollock, MD;  Location: MAIN OR [facility];  Service: Plastics  . PR REPAIR LUMBAR HERNIA Right 02/28/2022   Procedure: REPAIR LUMBAR HERNIA;  Surgeon: Vernida Pauline Pollock, MD;  Location: MAIN OR [facility];  Service: Plastics  [3] Family History Problem Relation Age of Onset  . Breast cancer Mother   . Heart disease Father

## 2024-01-15 NOTE — Nursing Note (Signed)
 Reviewed patient discharge instructions with patient. Tele monitored and PIVs removed. Pt transported to outpt pharmacy.

## 2024-01-15 NOTE — Consults (Addendum)
 [facility]  Care Management   Patient is a 73 y.o. admitted on 01/14/2024 for Malignant neoplasm of upper lobe of right lung (CMS-HCC) [C34.11]. Per review of the medical record and discussion with the treating team, the patient does not meet indicators for a full assessment at this time. CM will continue to assess for discharge needs and follow up, as indicated.  Asberry Packer, RN January 15, 2024 10:33 AM   Food Insecurity: No Food Insecurity (01/15/2024)   Hunger Vital Sign   . Worried About Programme researcher, broadcasting/film/video in the Last Year: Never true   . Ran Out of Food in the Last Year: Never true    Financial Resource Strain: Low Risk  (01/15/2024)   Overall Financial Resource Strain (CARDIA)   . Difficulty of Paying Living Expenses: Not very hard   Housing: Low Risk  (01/15/2024)   Transportation Needs: No Transportation Needs (01/15/2024)   PRAPARE - Transportation   . Lack of Transportation (Medical): No   . Lack of Transportation (Non-Medical): No

## 2024-01-15 NOTE — Consults (Signed)
 PHYSICAL THERAPY Evaluation (01/15/24 0828)      Patient Name:  Keith Riley      Medical Record Number: [id number]  Date of Birth: [birth year] Sex: Male      Post-Discharge Physical Therapy Recommendations: PT Post Acute Discharge Recommendations: Skilled PT services NOT indicated  Equipment Recommendation PT DME Recommendations: None        Treatment Diagnosis:  (decreased endurance)     ASSESSMENT Problem List: Decreased endurance, Pain    Assessment : Keith Riley is a 73 y.o. 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 Pt presents to PT at or near his mobility baseline, transferring and ambulating with SBA, with or without rollator. Pt with no further skilled acute PT needs, no f/u needs anticipated. Will d/c acute PT at this time. Encouraged pt to continue transferring and ambulating with nursing staff and RA s upervision.    Today's Interventions: Eval, ther act, pt ed: role of PT, continued daily ambulation, pacing, easing back into daily routine, all questions answered   Personal Factors/Comorbidities Present: 3+ factors  Examination of Body systems: 4+ elements Clinical Presentation: Unstable   Eval Complexity : High Complexity   Activity Tolerance: Tolerated treatment well    PLAN Planned Frequency of Treatment:   D/C Services Weekly Frequency: D/C Services       Goals:  Patient and Family Goals: to get crab cakes in [place] on his way home      Prognosis:  Excellent Positive Indicators: PLOF/CLOF Barriers to Discharge: None   SUBJECTIVE Communication Preference: Verbal    Patient reports: Pt/RN agreeable to PT Pain Comments: c/o 5/10 pain at surgical site, used PCA 1x during session. RN aware.     Prior Functional Status: PTA, pt reports he was indep with mobility and ADL, works as  a Midwife. Living Situation Living Environment: Apartment Lives With: Spouse, Son Home Living: One level home, Stairs to enter with rails, Tub/shower unit, Standard height toilet Rail placement (outside): Rail on right side Number of Stairs to Enter (outside): 13 Caregiver Identified?: Yes Caregiver Availability: 24 hours Caregiver Identified?: Yes  Equipment available at home: None     Past Medical History[1]        Social History   Tobacco Use  . Smoking status: Former    Current packs/day: 0.00    Average packs/day: 0.3 packs/day for 20.0 years (5.0 ttl pk-yrs)    Types: Cigarettes    Start date: 78    Quit date: 1998    Years since quitting: 27.7  . Smokeless tobacco: Never  Substance Use Topics  . Alcohol use: Not Currently     Past Surgical History[2]         Family History[3]   Allergies: Patient has no known allergies.            Objective Findings Precautions / Restrictions Precautions: Falls precautions Weight Bearing Status: Non-applicable Required Braces or Orthoses: Non-applicable   Medical Tests / Procedures: vitals, labs, orders, op notes reviewed in Epic Equipment / Environment: Vascular access (PIV, TLC, Port-a-cath, PICC), Telemetry, Arterial line, Chest Tube, Foley, PCA   Vitals/Orthostatics : HR 40s-50, poor pleth during ambulation, however mid-high 90s with good pleth at rest, no increase WOB or c/o SOB. Systolics elevated to 180s toward the end of ambulation, asymptomatic   Cognition: WFL Cognition comment: mildly impulsive Visual/Perception: Within Functional Limits Hearing:  No deficit identified   Skin Inspection: Intact where visualized   Upper Extremities UE ROM: Right WFL, Left WFL UE Strength: Right WFL, Left WFL  Lower Extremities LE ROM: Right WFL, Left WFL LE Strength: Right WFL, Left WFL      Coordination: WFL Proprioception: Not tested Sensation: WFL Posture: WFL  Static Sitting-Level of Assistance:  Water engineer Standing-Level of Assistance: Independent Standing Balance comments: no device    Bed Mobility comments: supine to sit with indep   Transfer comments: sit<>stand with SBA and rollator    Gait Level of Assistance: Standby assist, set-up cues, supervision of patient - no hands on Gait Assistive Device: Four wheel walker Gait Distance Ambulated (ft): 600 ft Skilled Treatment Performed: amb ~300 ft with rollator and SBA, ~300 ft with SBA and no device, steady with no LOB, cueing for slowing pace               Patient at end of session: Lines intact, In chair, Friends/Family present, All needs in reach  Physical Therapy Session Duration PT Individual [mins]: 10 PT Co-Treatment [mins]: 15 (seen with Keith Riley OT) Reason for Co-treatment: Discharge pending      AM-PAC-6 click Help currently need turning over In bed?: None - Modified Independent/Independent Help currently needed sitting down/standing up from chair with arms? : A Little - Minimal/Contact Guard Assist/Supervision Help currently needed moving from supine to sitting on edge of bed?: None - Modified Independent/Independent Help currently needed moving to and from bed from wheelchair?: A Little - Minimal/Contact Guard Assist/Supervision Help currently needed walking in a hospital room?: A Little - Minimal/Contact Guard Assist/Supervision Help currently needed climbing 3-5 steps with railing?: A Little - Minimal/Contact Guard Assist/Supervision  Basic Mobility Score 6 click: 20  6 click Score (in points): % of Functional Impairment, Limitation, Restriction 6: 100% impaired, limited, restricted 7-8: At least 80%, but less than 100% impaired, limited restricted 9-13: At least 60%, but less than 80% impaired, limited restricted 14-19: At least 40%, but less than 60% impaired, limited restricted 20-22: At least 20%, but less than 40% impaired, limited restricted 23: At least 1%, but less than 20%  impaired, limited restricted 24: 0% impaired, limited restricted  'AM-PAC' forms are Copyright protected by [employer] [facility]     I attest that I have reviewed the above information. Signed: Augustin SHAUNNA Lighter, PT Filed 01/15/2024        [1] Past Medical History: Diagnosis Date  . Hypertension   [2] Past Surgical History: Procedure Laterality Date  . CHG CT GUIDANCE NEEDLE PLACEMENT N/A 12/12/2023   Procedure: COMPUTED TOMOGRAPHY GUIDANCE FOR NEEDLE PLACEMENT (EG, BIOPSY, ASPIRATION, INJECTION, LOCALIZATION DEVICE), RADIOLOGICAL SUPERVISION AND INTERPRETATION;  Surgeon: Claudene Elsie Gee, MD;  Location: [facility];  Service: Pulmonary  . PR BRNCHSC EBUS GUIDED SAMPL 1/2 NODE STATION/STRUX N/A 12/12/2023   Procedure: BRONCH, RIGID OR FLEXIBLE, INC FLUORO GUIDANCE, WHEN PERFORMED; WITH EBUS GUIDED TRANSTRACHEAL AND/OR TRANSBRONCHIAL SAMPLING, ONE OR TWO MEDIASTINAL AND/OR HILAR LYMPH NODE STATIONS OR STRUCTURES;  Surgeon: Claudene Elsie Gee, MD;  Location: [facility];  Service: Pulmonary  . PR BRONCHOSCOPY,COMPUTER ASSIST/IMAGE-GUIDED NAVIGATION N/A 12/12/2023   Procedure: ROBOT ION BRONCHOSCOPY,RIGID OR FLEXIBLE,INCLUDE FLUORO WHEN PERFORMED; W/COMPUTER-ASSIST,IMAGE-GUIDED NAVIGATION;  Surgeon: Claudene Elsie Gee, MD;  Location: [facility];  Service: Pulmonary  . PR BRONCHOSCOPY,TRANSBRON ASPIR BX N/A 12/12/2023   Procedure: BRONCHOSCOPY, RIGID/FLEX, INCL FLUORO; W/TRANSBRONCH NDL ASPIRAT BX, TRACHEA, MAIN STEM &/OR LOBAR BRONCHUS;  Surgeon:  Claudene Elsie Gee, MD;  Location: [facility];  Service: Pulmonary  . PR BRONCHOSCOPY,TRANSBRONCH BIOPSY N/A 12/12/2023   Procedure: BRONCHOSCOPY, RIGID/FLEXIBLE, INCLUDE FLUORO GUIDANCE WHEN PERFORMED; W/TRANSBRONCHIAL LUNG BX, SINGLE LOBE;  Surgeon: Claudene Elsie Gee, MD;  Location: [facility];  Service: Pulmonary  . PR CYSTOSCOPY,INSERT URETERAL STENT Right 02/28/2022   Procedure: CYSTOURETHROSCOPY,   WITH INSERTION OF INDWELLING URETERAL STENT (EG, GIBBONS OR DOUBLE-J TYPE);  Surgeon: Waymond James, MD;  [facility];  Service: Urology  . 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 PR OMENTAL FLAP,INTRA-ABDOMINAL N/A 02/28/2022   Procedure: OMENTAL FLAP;  Surgeon: Luke Phebe Bellingham, MD;  Location: MAIN OR [facility];  Service: Surgical Oncology  . PR RAD RESECTION TUMOR SOFT TISSUE THIGH/KNEE 5+CM Right 02/28/2022   Procedure: RADICAL RESECTION OF TUMOR, SOFT TISSUE OF THIGH OR KNEE AREA; 5 CM OR GREATER;  Surgeon: Mardeen Lamar Rush, MD;  Location: [facility];  Service: Orthopedics  . PR RELEASE URETER,RETROPER FIBROSIS Right 02/28/2022   Procedure: URETEROLYSIS, WITH OR WITHOUT REPOSITIONING OF URETER FOR RETROPERITONEAL FIBROSIS;  Surgeon: Luke Phebe Bellingham, MD;  Location: MAIN OR [facility];  Service: Surgical Oncology  . PR REPAIR FEMORAL HERNIA,REDUCIBLE Right 02/28/2022   Procedure: REPAIR INITIAL FEMORAL HERNIA, ANY AGE; REDUCIBLE;  Surgeon: Vernida Pauline Pollock, MD;  Location: MAIN OR [facility];  Service: Plastics  . PR REPAIR LUMBAR HERNIA Right 02/28/2022   Procedure: REPAIR LUMBAR HERNIA;  Surgeon: Vernida Pauline Pollock, MD;  Location: MAIN OR [facility];  Service: Plastics  [3] Family History Problem Relation Age of Onset  . Breast cancer Mother   . Heart disease Father

## 2024-01-16 ENCOUNTER — Telehealth: Payer: Self-pay

## 2024-01-16 NOTE — Transitions of Care (Post Inpatient/ED Visit) (Signed)
   01/16/2024  Name: Keith Riley MRN: [id number] DOB: 09/27/1950  Today's TOC FU Call Status: Today's TOC FU Call Status:: Unsuccessful Call (1st Attempt) Unsuccessful Call (1st Attempt) Date: 01/16/24  Attempted to reach the patient regarding the most recent Inpatient/ED visit.  Follow Up Plan: Additional outreach attempts will be made to reach the patient to complete the Transitions of Care (Post Inpatient/ED visit) call.   Maude Gloor J. Delvonte Berenson RN, MSN [facility], [facility] RN Care Manager Direct Dial: [phone]  Fax: [phone] Website: [email or web address]

## 2024-01-19 ENCOUNTER — Telehealth: Payer: Self-pay

## 2024-01-19 NOTE — Transitions of Care (Post Inpatient/ED Visit) (Signed)
   01/19/2024  Name: Keith Riley MRN: [id number] DOB: [birth year]  Today's TOC FU Call Status: Today's TOC FU Call Status:: Unsuccessful Call (2nd Attempt) Unsuccessful Call (2nd Attempt) Date: 01/19/24  Attempted to reach the patient regarding the most recent Inpatient/ED visit.  Follow Up Plan: Additional outreach attempts will be made to reach the patient to complete the Transitions of Care (Post Inpatient/ED visit) call.   Carsin Randazzo J. Leba Tibbitts RN, MSN [facility], [facility] RN Care Manager Direct Dial: [phone]  Fax: [phone] Website: [email or web address]

## 2024-01-20 ENCOUNTER — Telehealth: Payer: Self-pay

## 2024-01-20 NOTE — Transitions of Care (Post Inpatient/ED Visit) (Signed)
   01/20/2024  Name: Keith Riley MRN: [id number] DOB: 1950/12/05  Today's TOC FU Call Status: Today's TOC FU Call Status:: Unsuccessful Call (3rd Attempt) Unsuccessful Call (3rd Attempt) Date: 01/20/24  Attempted to reach the patient regarding the most recent Inpatient/ED visit.  Follow Up Plan: No further outreach attempts will be made at this time. We have been unable to contact the patient.  Leea Rambeau J. Becka Lagasse RN, MSN [facility], [facility] RN Care Manager Direct Dial: [phone]  Fax: [phone] Website: [email or web address]

## 2024-02-14 DIAGNOSIS — I10 Essential (primary) hypertension: Secondary | ICD-10-CM | POA: Diagnosis not present

## 2024-02-14 DIAGNOSIS — Z809 Family history of malignant neoplasm, unspecified: Secondary | ICD-10-CM | POA: Diagnosis not present

## 2024-02-14 DIAGNOSIS — Z8249 Family history of ischemic heart disease and other diseases of the circulatory system: Secondary | ICD-10-CM | POA: Diagnosis not present

## 2024-02-14 DIAGNOSIS — Z85118 Personal history of other malignant neoplasm of bronchus and lung: Secondary | ICD-10-CM | POA: Diagnosis not present

## 2024-02-14 DIAGNOSIS — N400 Enlarged prostate without lower urinary tract symptoms: Secondary | ICD-10-CM | POA: Diagnosis not present

## 2024-02-14 DIAGNOSIS — J439 Emphysema, unspecified: Secondary | ICD-10-CM | POA: Diagnosis not present

## 2024-02-14 DIAGNOSIS — Z89022 Acquired absence of left finger(s): Secondary | ICD-10-CM | POA: Diagnosis not present

## 2024-02-16 DIAGNOSIS — H52223 Regular astigmatism, bilateral: Secondary | ICD-10-CM | POA: Diagnosis not present

## 2024-02-16 DIAGNOSIS — H524 Presbyopia: Secondary | ICD-10-CM | POA: Diagnosis not present

## 2024-02-16 DIAGNOSIS — H5213 Myopia, bilateral: Secondary | ICD-10-CM | POA: Diagnosis not present
